# Patient Record
Sex: Male | Born: 1960 | Race: White | Hispanic: No | Marital: Married | Smoking: Never smoker
Health system: Southern US, Community
[De-identification: ages and names within clinical notes are randomized; demographics above are authoritative.]

## PROBLEM LIST (undated history)

## (undated) DIAGNOSIS — N200 Calculus of kidney: Secondary | ICD-10-CM

## (undated) DIAGNOSIS — S62609A Fracture of unspecified phalanx of unspecified finger, initial encounter for closed fracture: Secondary | ICD-10-CM

## (undated) HISTORY — DX: Calculus of kidney: N20.0

## (undated) HISTORY — PX: KNEE ARTHROSCOPY: SUR90

---

## 1998-04-04 DIAGNOSIS — N200 Calculus of kidney: Secondary | ICD-10-CM

## 1998-04-04 HISTORY — PX: KNEE ARTHROTOMY: SUR107

## 1998-04-04 HISTORY — DX: Calculus of kidney: N20.0

## 2002-07-12 ENCOUNTER — Emergency Department (HOSPITAL_COMMUNITY): Admission: EM | Admit: 2002-07-12 | Discharge: 2002-07-12 | Payer: Self-pay | Admitting: Emergency Medicine

## 2005-06-17 ENCOUNTER — Ambulatory Visit: Payer: Self-pay | Admitting: Pulmonary Disease

## 2005-09-07 ENCOUNTER — Ambulatory Visit: Payer: Self-pay | Admitting: Pulmonary Disease

## 2005-09-08 ENCOUNTER — Ambulatory Visit (HOSPITAL_BASED_OUTPATIENT_CLINIC_OR_DEPARTMENT_OTHER): Admission: RE | Admit: 2005-09-08 | Discharge: 2005-09-08 | Payer: Self-pay | Admitting: Pulmonary Disease

## 2012-04-04 HISTORY — PX: COLONOSCOPY: SHX174

## 2012-04-06 ENCOUNTER — Ambulatory Visit (AMBULATORY_SURGERY_CENTER): Payer: BC Managed Care – PPO | Admitting: *Deleted

## 2012-04-06 VITALS — Ht 73.0 in | Wt 176.2 lb

## 2012-04-06 DIAGNOSIS — Z1211 Encounter for screening for malignant neoplasm of colon: Secondary | ICD-10-CM

## 2012-04-06 MED ORDER — MOVIPREP 100 G PO SOLR: ORAL | Status: DC

## 2012-04-09 ENCOUNTER — Encounter: Payer: Self-pay | Admitting: Internal Medicine

## 2012-04-27 ENCOUNTER — Encounter: Payer: Self-pay | Admitting: Internal Medicine

## 2012-04-27 ENCOUNTER — Ambulatory Visit (AMBULATORY_SURGERY_CENTER): Payer: BC Managed Care – PPO | Admitting: Internal Medicine

## 2012-04-27 VITALS — BP 99/56 | HR 47 | Temp 98.9°F | Resp 23 | Ht 73.0 in | Wt 176.0 lb

## 2012-04-27 DIAGNOSIS — D126 Benign neoplasm of colon, unspecified: Secondary | ICD-10-CM

## 2012-04-27 DIAGNOSIS — Z1211 Encounter for screening for malignant neoplasm of colon: Secondary | ICD-10-CM

## 2012-04-27 MED ORDER — SODIUM CHLORIDE 0.9 % IV SOLN: 500.0000 mL | INTRAVENOUS | Status: DC

## 2012-04-27 NOTE — Progress Notes (Deleted)
YOU HAD AN ENDOSCOPIC PROCEDURE TODAY AT THE [facility]: Refer to the procedure report that was given to you for any specific questions about what was found during the examination.  If the procedure report does not answer your questions, please call your gastroenterologist to clarify.  If you requested that your care partner not be given the details of your procedure findings, then the procedure report has been included in a sealed envelope for you to review at your convenience later.  YOU SHOULD EXPECT: Some feelings of bloating in the abdomen. Passage of more gas than usual.  Walking can help get rid of the air that was put into your GI tract during the procedure and reduce the bloating. If you had a lower endoscopy (such as a colonoscopy or flexible sigmoidoscopy) you may notice spotting of blood in your stool or on the toilet paper. If you underwent a bowel prep for your procedure, then you may not have a normal bowel movement for a few days.  DIET: Your first meal following the procedure should be a light meal and then it is ok to progress to your normal diet.  A half-sandwich or bowl of soup is an example of a good first meal.  Heavy or fried foods are harder to digest and may make you feel nauseous or bloated.  Likewise meals heavy in dairy and vegetables can cause extra gas to form and this can also increase the bloating.  Drink plenty of fluids but you should avoid alcoholic beverages for 24 hours.  ACTIVITY: Your care partner should take you home directly after the procedure.  You should plan to take it easy, moving slowly for the rest of the day.  You can resume normal activity the day after the procedure however you should NOT DRIVE or use heavy machinery for 24 hours (because of the sedation medicines used during the test).    SYMPTOMS TO REPORT IMMEDIATELY: A gastroenterologist can be reached at any hour.  During normal business hours, 8:30 AM to 5:00 PM Monday through Friday,  call [phone].  After hours and on weekends, please call the GI answering service at [phone] who will take a message and have the physician on call contact you.   Following lower endoscopy (colonoscopy or flexible sigmoidoscopy):  Excessive amounts of blood in the stool  Significant tenderness or worsening of abdominal pains  Swelling of the abdomen that is new, acute  Fever of 100F or higher   FOLLOW UP: If any biopsies were taken you will be contacted by phone or by letter within the next 1-3 weeks.  Call your gastroenterologist if you have not heard about the biopsies in 3 weeks.  Our staff will call the home number listed on your records the next business day following your procedure to check on you and address any questions or concerns that you may have at that time regarding the information given to you following your procedure. This is a courtesy call and so if there is no answer at the home number and we have not heard from you through the emergency physician on call, we will assume that you have returned to your regular daily activities without incident.   Hold aspirin, aspirin containing products, and NSAIDS for 2 weeks  Await biopsy results  Continue all other medications  SIGNATURES/CONFIDENTIALITY: You and/or your care partner have signed paperwork which will be entered into your electronic medical record.  These signatures attest to the fact that that  the information above on your After Visit Summary has been reviewed and is understood.  Full responsibility of the confidentiality of this discharge information lies with you and/or your care-partner.

## 2012-04-27 NOTE — Op Note (Signed)
[facility] [address] [place], [address]   COLONOSCOPY PROCEDURE REPORT  PATIENT: Dean Marquez, Dean Marquez  MR#: [id number] BIRTHDATE: [birth year] , 51  yrs. old GENDER: Male ENDOSCOPIST: Beverley Fiedler, MD REFERRED BY: Percell Boston, MD PROCEDURE DATE:  04/27/2012 PROCEDURE:   Colonoscopy with snare polypectomy and Colonoscopy with cold biopsy polypectomy ASA CLASS:   Class I INDICATIONS:average risk screening and first colonoscopy. MEDICATIONS: MAC sedation, administered by CRNA and propofol (Diprivan) 400mg  IV  DESCRIPTION OF PROCEDURE:   After the risks benefits and alternatives of the procedure were thoroughly explained, informed consent was obtained.  A digital rectal exam revealed no rectal mass.   The LB [id number]  endoscope was introduced through the anus and advanced to the terminal ileum which was intubated for a short distance. No adverse events experienced.   The quality of the prep was good, using MoviPrep  The instrument was then slowly withdrawn as the colon was fully examined.   COLON FINDINGS: The mucosa appeared normal in the terminal ileum. A sessile polyp measuring 7 mm in size was found at the cecum.  A polypectomy was performed with a cold snare.  The resection was complete and the polyp tissue was completely retrieved.   A sessile polyp measuring 2 mm in size was found at the cecum.  A polypectomy was performed with cold forceps.  The resection was complete and the polyp tissue was completely retrieved.   The colon mucosa was otherwise normal.  Retroflexed views revealed no abnormalities. The time to cecum=3 minutes 23 seconds.  Withdrawal time=11 minutes 36 seconds.  The scope was withdrawn and the procedure completed. COMPLICATIONS: There were no complications.  ENDOSCOPIC IMPRESSION: 1.   Normal mucosa in the terminal ileum 2.   Sessile polyp measuring 7 mm in size was found at the cecum; polypectomy was performed with a cold  snare 3.   Sessile polyp measuring 2 mm in size was found at the cecum; polypectomy was performed with cold forceps 4.   The colon mucosa was otherwise normal  RECOMMENDATIONS: 1.  Hold aspirin, aspirin products, and anti-inflammatory medication for 2 weeks. 2.  Await pathology results 3.  If the polyps removed today are proven to be adenomatous (pre-cancerous) polyps, you will need a repeat colonoscopy in 5 years.  Otherwise you should continue to follow colorectal cancer screening guidelines for "routine risk" patients with colonoscopy in 10 years.  You will receive a letter within 1-2 weeks with the results of your biopsy as well as final recommendations.  Please call my office if you have not received a letter after 3 weeks.   eSigned:  Beverley Fiedler, MD 04/27/2012 10:02 AM      cc: The Patient, Percell Boston, MD

## 2012-04-27 NOTE — Progress Notes (Signed)
Patient did not experience any of the following events: a burn prior to discharge; a fall within the facility; wrong site/side/patient/procedure/implant event; or a hospital transfer or hospital admission upon discharge from the facility. (G8907) Patient did not have preoperative order for IV antibiotic SSI prophylaxis. (G8918)  

## 2012-04-27 NOTE — Progress Notes (Signed)
Pt stables to RR 

## 2012-04-27 NOTE — Patient Instructions (Addendum)

## 2012-04-30 ENCOUNTER — Telehealth: Payer: Self-pay | Admitting: *Deleted

## 2012-04-30 NOTE — Telephone Encounter (Signed)
  Follow up Call-  Call back number 04/27/2012  Post procedure Call Back phone  # [phone]  Permission to leave phone message Yes     Patient questions:  Do you have a fever, pain , or abdominal swelling? no Pain Score  0 *  Have you tolerated food without any problems? yes  Have you been able to return to your normal activities? yes  Do you have any questions about your discharge instructions: Diet   no Medications  no Follow up visit  no  Do you have questions or concerns about your Care? no  Actions: * If pain score is 4 or above: No action needed, pain <4.

## 2012-05-02 ENCOUNTER — Encounter: Payer: Self-pay | Admitting: Internal Medicine

## 2015-07-24 ENCOUNTER — Other Ambulatory Visit: Payer: Self-pay | Admitting: Orthopedic Surgery

## 2015-07-24 ENCOUNTER — Ambulatory Visit
Admission: RE | Admit: 2015-07-24 | Discharge: 2015-07-24 | Disposition: A | Discharge status: Home / Self Care | Payer: BLUE CROSS/BLUE SHIELD | Source: Ambulatory Visit | Attending: Orthopedic Surgery | Admitting: Orthopedic Surgery

## 2015-07-24 DIAGNOSIS — S62009S Unspecified fracture of navicular [scaphoid] bone of unspecified wrist, sequela: Secondary | ICD-10-CM

## 2017-04-14 ENCOUNTER — Encounter (HOSPITAL_BASED_OUTPATIENT_CLINIC_OR_DEPARTMENT_OTHER): Payer: Self-pay | Admitting: *Deleted

## 2017-04-14 ENCOUNTER — Other Ambulatory Visit: Payer: Self-pay

## 2017-04-14 ENCOUNTER — Other Ambulatory Visit: Payer: Self-pay | Admitting: Orthopedic Surgery

## 2017-04-15 NOTE — H&P (Signed)
Dean Marquez is an 57 y.o. male.   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  Because of continued pain, bruising, swelling, and noticeable deformity, he presented to [facility] on 04-10-17, and has been splinted with buddy taping since.  He noticed particularly that his finger had angulation, causing clumsiness with keyboarding.  He also plays the piano.   Past Medical History:  Diagnosis Date  . Finger fracture, right    5th proximal phalanx  . Kidney stones 2000    Past Surgical History:  Procedure Laterality Date  . KNEE ARTHROSCOPY Right   . KNEE ARTHROTOMY  2000   right    Family History  Problem Relation Age of Onset  . Colon cancer Father 22  . Stomach cancer Neg Hx    Social History:  reports that  has never smoked. he has never used smokeless tobacco. He reports that he drinks about 0.6 oz of alcohol per week. He reports that he does not use drugs.  Allergies: No Known Allergies  No medications prior to admission.    No results found for this or any previous visit (from the past 48 hour(s)). No results found.  Review of Systems  All other systems reviewed and are negative.   Height 6\' 1"  (1.854 m), weight 77.1 kg (170 lb). Physical Exam  Constitutional:  WD, WN, NAD HEENT:  NCAT, EOMI Neuro/Psych:  Alert & oriented to person, place, and time; appropriate mood & affect Lymphatic: No generalized UE edema or lymphadenopathy Extremities / MSK:  Both UE are normal with respect to appearance, ranges of motion, joint stability, muscle strength/tone, sensation, & perfusion except as otherwise noted:  The left ring finger is swollen and bruised about the proximal phalanx.  There is slight ulnar angulation deformity noted, causing some separation and touchdown between the tips of the ring and small fingers with  flexion.  Flexion is nearly full, despite the deformity  Labs / Xrays:  No radiographic studies obtained today.  Previous x-rays are reviewed, revealing a short oblique, perhaps slightly spiral fracture through the shaft of the proximal phalanx of the left small finger, with some overall shortening and slight ulnar angulation  Assessment: Angulated displaced left small finger proximal phalanx fracture  Plan:  I discussed these findings with him.  I discussed what he might expect with regard to outcomes were he to decide upon nonoperative treatment, as well as operative treatment.  In the interim, while he is deciding, he may continue to buddy tape.  I recommended perhaps using Coban rather than tape, and provided him a role for this.  He will decide over the next day or so and contact me should he decide to proceed operatively so that we can place him on the schedule for next week, likely Monday.    Jolyn Nap, MD 04/15/2017, 10:26 AM

## 2017-04-17 ENCOUNTER — Encounter (HOSPITAL_BASED_OUTPATIENT_CLINIC_OR_DEPARTMENT_OTHER)
Admission: RE | Disposition: A | Discharge status: Home / Self Care | Payer: Self-pay | Source: Ambulatory Visit | Attending: Orthopedic Surgery

## 2017-04-17 ENCOUNTER — Ambulatory Visit (HOSPITAL_BASED_OUTPATIENT_CLINIC_OR_DEPARTMENT_OTHER)
Admission: RE | Admit: 2017-04-17 | Discharge: 2017-04-17 | Disposition: A | Discharge status: Home / Self Care | Payer: BLUE CROSS/BLUE SHIELD | Source: Ambulatory Visit | Attending: Orthopedic Surgery | Admitting: Orthopedic Surgery

## 2017-04-17 ENCOUNTER — Other Ambulatory Visit: Payer: Self-pay

## 2017-04-17 ENCOUNTER — Encounter (HOSPITAL_BASED_OUTPATIENT_CLINIC_OR_DEPARTMENT_OTHER): Payer: Self-pay | Admitting: Certified Registered"

## 2017-04-17 ENCOUNTER — Ambulatory Visit (HOSPITAL_BASED_OUTPATIENT_CLINIC_OR_DEPARTMENT_OTHER): Payer: BLUE CROSS/BLUE SHIELD | Admitting: Certified Registered"

## 2017-04-17 ENCOUNTER — Ambulatory Visit (HOSPITAL_COMMUNITY): Payer: BLUE CROSS/BLUE SHIELD

## 2017-04-17 DIAGNOSIS — S62617A Displaced fracture of proximal phalanx of left little finger, initial encounter for closed fracture: Secondary | ICD-10-CM | POA: Diagnosis present

## 2017-04-17 DIAGNOSIS — Y9367 Activity, basketball: Secondary | ICD-10-CM | POA: Diagnosis not present

## 2017-04-17 DIAGNOSIS — Z419 Encounter for procedure for purposes other than remedying health state, unspecified: Secondary | ICD-10-CM

## 2017-04-17 HISTORY — PX: OPEN REDUCTION INTERNAL FIXATION (ORIF) PROXIMAL PHALANX: SHX6235

## 2017-04-17 HISTORY — DX: Fracture of unspecified phalanx of unspecified finger, initial encounter for closed fracture: S62.609A

## 2017-04-17 SURGERY — OPEN REDUCTION INTERNAL FIXATION (ORIF) PROXIMAL PHALANX
Anesthesia: Monitor Anesthesia Care | Site: Little Finger | Laterality: Right

## 2017-04-17 MED ORDER — IBUPROFEN 200 MG PO TABS: 600.0000 mg | ORAL_TABLET | Freq: Four times a day (QID) | ORAL | 0 refills | Status: DC | PRN

## 2017-04-17 MED ORDER — MIDAZOLAM HCL 2 MG/2ML IJ SOLN
INTRAMUSCULAR | Status: AC
  Filled 2017-04-17: qty 2

## 2017-04-17 MED ORDER — EPHEDRINE 5 MG/ML INJ
INTRAVENOUS | Status: AC
  Filled 2017-04-17: qty 50

## 2017-04-17 MED ORDER — PROPOFOL 500 MG/50ML IV EMUL
INTRAVENOUS | Status: DC | PRN
  Administered 2017-04-17: 75 ug/kg/min via INTRAVENOUS

## 2017-04-17 MED ORDER — LACTATED RINGERS IV SOLN: INTRAVENOUS | Status: DC

## 2017-04-17 MED ORDER — ONDANSETRON HCL 4 MG/2ML IJ SOLN
INTRAMUSCULAR | Status: AC
  Filled 2017-04-17: qty 10

## 2017-04-17 MED ORDER — SCOPOLAMINE 1 MG/3DAYS TD PT72: 1.0000 | MEDICATED_PATCH | Freq: Once | TRANSDERMAL | Status: DC | PRN

## 2017-04-17 MED ORDER — FENTANYL CITRATE (PF) 100 MCG/2ML IJ SOLN: 25.0000 ug | INTRAMUSCULAR | Status: DC | PRN

## 2017-04-17 MED ORDER — CEFAZOLIN SODIUM-DEXTROSE 2-4 GM/100ML-% IV SOLN
INTRAVENOUS | Status: AC
  Filled 2017-04-17: qty 100

## 2017-04-17 MED ORDER — LACTATED RINGERS IV SOLN
INTRAVENOUS | Status: DC
  Administered 2017-04-17: 09:00:00 via INTRAVENOUS

## 2017-04-17 MED ORDER — MIDAZOLAM HCL 2 MG/2ML IJ SOLN: 1.0000 mg | INTRAMUSCULAR | Status: DC | PRN

## 2017-04-17 MED ORDER — LIDOCAINE HCL (PF) 1 % IJ SOLN
INTRAMUSCULAR | Status: DC | PRN
  Administered 2017-04-17: 5 mL

## 2017-04-17 MED ORDER — ACETAMINOPHEN 325 MG PO TABS: 650.0000 mg | ORAL_TABLET | Freq: Four times a day (QID) | ORAL | Status: DC | PRN

## 2017-04-17 MED ORDER — ONDANSETRON HCL 4 MG/2ML IJ SOLN
INTRAMUSCULAR | Status: DC | PRN
  Administered 2017-04-17: 4 mg via INTRAVENOUS

## 2017-04-17 MED ORDER — LIDOCAINE HCL (CARDIAC) 20 MG/ML IV SOLN
INTRAVENOUS | Status: DC | PRN
  Administered 2017-04-17: 60 mg via INTRAVENOUS

## 2017-04-17 MED ORDER — FENTANYL CITRATE (PF) 100 MCG/2ML IJ SOLN: 50.0000 ug | INTRAMUSCULAR | Status: DC | PRN

## 2017-04-17 MED ORDER — PROPOFOL 500 MG/50ML IV EMUL
INTRAVENOUS | Status: AC
  Filled 2017-04-17: qty 100

## 2017-04-17 MED ORDER — DEXAMETHASONE SODIUM PHOSPHATE 10 MG/ML IJ SOLN
INTRAMUSCULAR | Status: AC
  Filled 2017-04-17: qty 2

## 2017-04-17 MED ORDER — LIDOCAINE 2% (20 MG/ML) 5 ML SYRINGE
INTRAMUSCULAR | Status: AC
  Filled 2017-04-17: qty 20

## 2017-04-17 MED ORDER — HYDROCODONE-ACETAMINOPHEN 5-325 MG PO TABS: 1.0000 | ORAL_TABLET | Freq: Four times a day (QID) | ORAL | 0 refills | Status: DC | PRN

## 2017-04-17 MED ORDER — MEPERIDINE HCL 25 MG/ML IJ SOLN: 6.2500 mg | INTRAMUSCULAR | Status: DC | PRN

## 2017-04-17 MED ORDER — BUPIVACAINE-EPINEPHRINE 0.5% -1:200000 IJ SOLN
INTRAMUSCULAR | Status: DC | PRN
  Administered 2017-04-17: 5 mL

## 2017-04-17 MED ORDER — METOCLOPRAMIDE HCL 5 MG/ML IJ SOLN: 10.0000 mg | Freq: Once | INTRAMUSCULAR | Status: DC | PRN

## 2017-04-17 MED ORDER — FENTANYL CITRATE (PF) 100 MCG/2ML IJ SOLN
INTRAMUSCULAR | Status: AC
  Filled 2017-04-17: qty 2

## 2017-04-17 MED ORDER — CEFAZOLIN SODIUM-DEXTROSE 2-4 GM/100ML-% IV SOLN
2.0000 g | INTRAVENOUS | Status: AC
  Administered 2017-04-17: 2 g via INTRAVENOUS

## 2017-04-17 MED ORDER — PHENYLEPHRINE 40 MCG/ML (10ML) SYRINGE FOR IV PUSH (FOR BLOOD PRESSURE SUPPORT)
PREFILLED_SYRINGE | INTRAVENOUS | Status: AC
  Filled 2017-04-17: qty 10

## 2017-04-17 SURGICAL SUPPLY — 62 items
BANDAGE COBAN STERILE 2 (GAUZE/BANDAGES/DRESSINGS) IMPLANT
BIT DRILL 1.1 (BIT) ×2
BIT DRILL 60X20X1.1XQC TMX (BIT) IMPLANT
BIT DRL 60X20X1.1XQC TMX (BIT) ×1
BLADE MINI RND TIP GREEN BEAV (BLADE) IMPLANT
BLADE SURG 15 STRL LF DISP TIS (BLADE) ×1 IMPLANT
BLADE SURG 15 STRL SS (BLADE) ×2
BNDG CMPR 9X4 STRL LF SNTH (GAUZE/BANDAGES/DRESSINGS) ×1
BNDG COHESIVE 4X5 TAN STRL (GAUZE/BANDAGES/DRESSINGS) ×2 IMPLANT
BNDG ESMARK 4X9 LF (GAUZE/BANDAGES/DRESSINGS) ×1 IMPLANT
BNDG GAUZE ELAST 4 BULKY (GAUZE/BANDAGES/DRESSINGS) ×2 IMPLANT
CAP PIN PROTECTOR ORTHO WHT (CAP) IMPLANT
CHLORAPREP W/TINT 26ML (MISCELLANEOUS) ×2 IMPLANT
CORD BIPOLAR FORCEPS 12FT (ELECTRODE) ×2 IMPLANT
COVER BACK TABLE 60X90IN (DRAPES) ×2 IMPLANT
COVER MAYO STAND STRL (DRAPES) ×2 IMPLANT
CUFF TOURNIQUET SINGLE 18IN (TOURNIQUET CUFF) ×1 IMPLANT
DRAPE C-ARM 42X72 X-RAY (DRAPES) ×2 IMPLANT
DRAPE EXTREMITY T 121X128X90 (DRAPE) ×2 IMPLANT
DRAPE SURG 17X23 STRL (DRAPES) ×2 IMPLANT
DRIVER BIT 1.5 (TRAUMA) ×1 IMPLANT
DRSG EMULSION OIL 3X3 NADH (GAUZE/BANDAGES/DRESSINGS) ×2 IMPLANT
GAUZE SPONGE 4X4 12PLY STRL LF (GAUZE/BANDAGES/DRESSINGS) ×2 IMPLANT
GLOVE BIO SURGEON STRL SZ7.5 (GLOVE) ×2 IMPLANT
GLOVE BIOGEL PI IND STRL 7.0 (GLOVE) ×1 IMPLANT
GLOVE BIOGEL PI IND STRL 8 (GLOVE) ×1 IMPLANT
GLOVE BIOGEL PI INDICATOR 7.0 (GLOVE) ×1
GLOVE BIOGEL PI INDICATOR 8 (GLOVE) ×1
GLOVE ECLIPSE 6.5 STRL STRAW (GLOVE) ×2 IMPLANT
GOWN STRL REUS W/ TWL LRG LVL3 (GOWN DISPOSABLE) ×2 IMPLANT
GOWN STRL REUS W/TWL LRG LVL3 (GOWN DISPOSABLE) ×4
GOWN STRL REUS W/TWL XL LVL3 (GOWN DISPOSABLE) ×2 IMPLANT
K-WIRE .045X4 (WIRE) IMPLANT
K-WIRE 9  SMOOTH .045 (WIRE) IMPLANT
LOCK SCREW 1.5X9MM (Screw) ×2 IMPLANT
NDL HYPO 25X1 1.5 SAFETY (NEEDLE) IMPLANT
NEEDLE HYPO 25X1 1.5 SAFETY (NEEDLE) ×2 IMPLANT
NS IRRIG 1000ML POUR BTL (IV SOLUTION) ×2 IMPLANT
PACK BASIN DAY SURGERY FS (CUSTOM PROCEDURE TRAY) ×2 IMPLANT
PADDING CAST ABS 4INX4YD NS (CAST SUPPLIES) ×1
PADDING CAST ABS COTTON 4X4 ST (CAST SUPPLIES) IMPLANT
PLATE T SMALL 1.5MM (Plate) ×1 IMPLANT
RUBBERBAND STERILE (MISCELLANEOUS) ×2 IMPLANT
SCREW L 1.5X12 (Screw) ×2 IMPLANT
SCREW L 1.5X14 (Screw) ×1 IMPLANT
SCREW LOCK 1.5X9MM (Screw) IMPLANT
SCREW LOCKING 1.5X10 (Screw) ×2 IMPLANT
SCREW LOCKING 1.5X11MM (Screw) ×2 IMPLANT
SPLINT PLASTER CAST XFAST 3X15 (CAST SUPPLIES) IMPLANT
SPLINT PLASTER XTRA FASTSET 3X (CAST SUPPLIES)
STOCKINETTE 6  STRL (DRAPES) ×1
STOCKINETTE 6 STRL (DRAPES) ×1 IMPLANT
SUCTION FRAZIER HANDLE 10FR (MISCELLANEOUS)
SUCTION TUBE FRAZIER 10FR DISP (MISCELLANEOUS) IMPLANT
SUT VICRYL RAPIDE 4-0 (SUTURE) ×2 IMPLANT
SUT VICRYL RAPIDE 4/0 PS 2 (SUTURE) ×1 IMPLANT
SYR 10ML LL (SYRINGE) ×1 IMPLANT
SYR BULB 3OZ (MISCELLANEOUS) ×1 IMPLANT
TOWEL OR 17X24 6PK STRL BLUE (TOWEL DISPOSABLE) ×2 IMPLANT
TOWEL OR NON WOVEN STRL DISP B (DISPOSABLE) ×2 IMPLANT
TUBE CONNECTING 20X1/4 (TUBING) IMPLANT
UNDERPAD 30X30 (UNDERPADS AND DIAPERS) ×2 IMPLANT

## 2017-04-17 NOTE — Anesthesia Postprocedure Evaluation (Signed)
Anesthesia Post Note  Patient: Dean Marquez  Procedure(s) Performed: OPEN REDUCTION INTERNAL FIXATION (ORIF) PROXIMAL PHALANX (Right Little Finger)     Anesthesia Post Evaluation  Last Vitals:  Vitals:   04/17/17 1215 04/17/17 1246  BP: 105/81 114/75  Pulse: 64 62  Resp: 20 16  Temp:  36.5 C  SpO2: 100% 100%    Last Pain:  Vitals:   04/17/17 1246  TempSrc: Oral  PainSc: 0-No pain                 Montez Hageman

## 2017-04-17 NOTE — Anesthesia Preprocedure Evaluation (Signed)
Anesthesia Evaluation  Patient identified by MRN, date of birth, ID band Patient awake    Reviewed: Allergy & Precautions, NPO status , Patient's Chart, lab work & pertinent test results  Airway Mallampati: II  TM Distance: >3 FB Neck ROM: Full    Dental no notable dental hx.    Pulmonary neg pulmonary ROS,    Pulmonary exam normal breath sounds clear to auscultation       Cardiovascular negative cardio ROS Normal cardiovascular exam Rhythm:Regular Rate:Normal     Neuro/Psych negative neurological ROS  negative psych ROS   GI/Hepatic negative GI ROS, Neg liver ROS,   Endo/Other  negative endocrine ROS  Renal/GU negative Renal ROS  negative genitourinary   Musculoskeletal negative musculoskeletal ROS (+)   Abdominal   Peds negative pediatric ROS (+)  Hematology negative hematology ROS (+)   Anesthesia Other Findings   Reproductive/Obstetrics negative OB ROS                             Anesthesia Physical Anesthesia Plan  ASA: I  Anesthesia Plan: MAC   Post-op Pain Management:    Induction: Intravenous  PONV Risk Score and Plan: 1 and Treatment may vary due to age or medical condition  Airway Management Planned: Natural Airway  Additional Equipment:   Intra-op Plan:   Post-operative Plan:   Informed Consent: I have reviewed the patients History and Physical, chart, labs and discussed the procedure including the risks, benefits and alternatives for the proposed anesthesia with the patient or authorized representative who has indicated his/her understanding and acceptance.   Dental advisory given  Plan Discussed with: CRNA  Anesthesia Plan Comments: (Pt prefers digital block with no sedation)        Anesthesia Quick Evaluation

## 2017-04-17 NOTE — Transfer of Care (Signed)
Immediate Anesthesia Transfer of Care Note  Patient: Dean Marquez  Procedure(s) Performed: OPEN REDUCTION INTERNAL FIXATION (ORIF) PROXIMAL PHALANX (Right Little Finger)  Patient Location: PACU  Anesthesia Type:MAC combined with regional for post-op pain  Level of Consciousness: awake, alert , oriented and patient cooperative  Airway & Oxygen Therapy: Patient Spontanous Breathing and Patient connected to face mask oxygen  Post-op Assessment: Report given to RN and Post -op Vital signs reviewed and stable  Post vital signs: Reviewed and stable  Last Vitals:  Vitals:   04/17/17 0857  BP: 100/60  Pulse: (!) 52  Resp: 16  Temp: 36.7 C  SpO2: 97%    Last Pain:  Vitals:   04/17/17 0857  TempSrc: Oral  PainSc: 1       Patients Stated Pain Goal: 1 (27/25/36 6440)  Complications: No apparent anesthesia complications

## 2017-04-17 NOTE — Op Note (Addendum)
04/17/2017  10:42 AM  PATIENT:  Dean Marquez  57 y.o. male  PRE-OPERATIVE DIAGNOSIS:  Displaced R SF P1 shaft fx  POST-OPERATIVE DIAGNOSIS:  Same  PROCEDURE:  ORIF R SF P1 fx  SURGEON: Rayvon Char. Grandville Silos, MD  PHYSICIAN ASSISTANT: Morley Kos, OPA-C  ANESTHESIA:  local and MAC  SPECIMENS:  None  DRAINS:   None  EBL:  less than 50 mL  PREOPERATIVE INDICATIONS:  Dean Marquez is a  57 y.o. male with a displaced R SF P1 fx.  The risks benefits and alternatives were discussed with the patient preoperatively including but not limited to the risks of infection, bleeding, nerve injury, cardiopulmonary complications, the need for revision surgery, among others, and the patient verbalized understanding and consented to proceed.  OPERATIVE IMPLANTS: Biomet ALPS 1.50m plate/screws  OPERATIVE PROCEDURE:  After receiving prophylactic antibiotics, the patient was escorted to the operative theatre and placed in a supine position.   A surgical "time-out" was performed during which the planned procedure, proposed operative site, and the correct patient identity were compared to the operative consent and agreement confirmed by the circulating nurse according to current facility policy.  Digital block with a mixture of lidocaine and Marcaine Baring epinephrine was performed by me. Following application of a tourniquet to the operative extremity, the exposed skin was prepped with Chloraprep and draped in the usual sterile fashion.  The limb was exsanguinated with an Esmarch bandage and the tourniquet inflated to approximately 1074mg higher than systolic BP.  An incision was marked that created a very broad-based radially based dorsal flap.  The skin was incised sharply with a scalpel, subcutaneous tissues were dissected with blunt and spreading dissection.  The extensor apparatus was identified and split in the midline and reflected.  The periosteum was also reflected.  The fracture was identified,  reduced, and secured provisionally the before a 1.5 mm plate was applied in standard fashion with locking screws. The fracture was actually comminuted, with a long dorsal shell piece as a separate fragment, may be 8 or 9 mm long by 3-4 wide.  There was a separate volar fragment as well.  The plate was a small T plate from the set.  Reduction was judged to be near-anatomic, and although the digit appeared still very slightly ulnarly angulated, it was difficult to tell if it was through the fracture or just the MP joint due to the local anesthetic that had been administered.  With passive flexion, the digits touchdown point was now nicely adjacent to the ring finger, which was a correction from its preoperative condition.  Final fluoroscopic images were obtained and saved.  Attention was turned to closure.  The wound was irrigated, the periosteum reapproximated with 4-0 Vicryl Rapide running suture.  The extensor tendon was repaired with running 4-0 Vicryl suture and the tourniquet was deflated.  The wound was irrigated, some additional hemostasis obtained and the skin was closed with 4-0 Vicryl Rapide mixture of interrupted and running horizontal mattress sutures.  A short arm splint dressing was applied, placing the ring and small fingers together, flexed at the MP joint and allowing the thumb, index, and long fingers out.  He was taken to the recovery room in stable condition.  DISPOSITION: He will be discharged home today with typical instructions, returning later this week to therapy to have a splint constructed, begin range of motion exercises, and return to see me in 10-15 days with new x-rays of the right small finger out of splint.

## 2017-04-17 NOTE — Anesthesia Procedure Notes (Signed)
Procedure Name: MAC Date/Time: 04/17/2017 11:00 AM Performed by: Signe Colt, CRNA Pre-anesthesia Checklist: Patient identified, Emergency Drugs available, Suction available, Patient being monitored and Timeout performed Patient Re-evaluated:Patient Re-evaluated prior to induction Oxygen Delivery Method: Simple face mask

## 2017-04-17 NOTE — Discharge Instructions (Signed)
Discharge Instructions   You have a dressing with a plaster splint incorporated in it. Move your fingers as much as possible, making a full fist and fully opening the fist. Elevate your hand to reduce pain & swelling of the digits.  Ice over the operative site may be helpful to reduce pain & swelling.  DO NOT USE HEAT. Pain medicine has been prescribed for you.  Tylenol and Ibuprofen can be taken together every 6 hours. Take Norco for severe breakthrough pain. Leave the dressing in place until you return to our office.  You may shower, but keep the bandage clean & dry.  You may drive a car when you are off of prescription pain medications and can safely control your vehicle with both hands. Our office will call you to arrange follow-up   Please call [phone] during normal business hours or [phone] after hours for any problems. Including the following:  - excessive redness of the incisions - drainage for more than 4 days - fever of more than 101.5 F  *Please note that pain medications will not be refilled after hours or on weekends.   Post Anesthesia Home Care Instructions  Activity: Get plenty of rest for the remainder of the day. A responsible individual must stay with you for 24 hours following the procedure.  For the next 24 hours, DO NOT: -Drive a car -Paediatric nurse -Drink alcoholic beverages -Take any medication unless instructed by your physician -Make any legal decisions or sign important papers.  Meals: Start with liquid foods such as gelatin or soup. Progress to regular foods as tolerated. Avoid greasy, spicy, heavy foods. If nausea and/or vomiting occur, drink only clear liquids until the nausea and/or vomiting subsides. Call your physician if vomiting continues.  Special Instructions/Symptoms: Your throat may feel dry or sore from the anesthesia or the breathing tube placed in your throat during surgery. If this causes discomfort, gargle with warm salt  water. The discomfort should disappear within 24 hours.  If you had a scopolamine patch placed behind your ear for the management of post- operative nausea and/or vomiting:  1. The medication in the patch is effective for 72 hours, after which it should be removed.  Wrap patch in a tissue and discard in the trash. Wash hands thoroughly with soap and water. 2. You may remove the patch earlier than 72 hours if you experience unpleasant side effects which may include dry mouth, dizziness or visual disturbances. 3. Avoid touching the patch. Wash your hands with soap and water after contact with the patch.

## 2017-04-17 NOTE — Interval H&P Note (Signed)
History and Physical Interval Note:  04/17/2017 10:42 AM  Dean Marquez  has presented today for surgery, with the diagnosis of DISPLACED PROXIMAL PHALANX FRACTURE S62.616A  The various methods of treatment have been discussed with the patient and family. After consideration of risks, benefits and other options for treatment, the patient has consented to  Procedure(s): OPEN REDUCTION INTERNAL FIXATION (ORIF) PROXIMAL PHALANX (Right) as a surgical intervention .  The patient's history has been reviewed, patient examined, no change in status, stable for surgery.  I have reviewed the patient's chart and labs.  Questions were answered to the patient's satisfaction.     Jolyn Nap

## 2017-04-18 ENCOUNTER — Encounter (HOSPITAL_BASED_OUTPATIENT_CLINIC_OR_DEPARTMENT_OTHER): Payer: Self-pay | Admitting: Orthopedic Surgery

## 2017-04-18 NOTE — Addendum Note (Signed)
Addendum  created 04/18/17 9702 by Donita Newland, Ernesta Amble, CRNA   Charge Capture section accepted

## 2017-05-09 ENCOUNTER — Encounter: Payer: Self-pay | Admitting: Internal Medicine

## 2018-09-15 ENCOUNTER — Other Ambulatory Visit: Payer: Self-pay | Admitting: Nephrology

## 2019-06-19 ENCOUNTER — Encounter: Payer: Self-pay | Admitting: Internal Medicine

## 2019-07-17 ENCOUNTER — Other Ambulatory Visit: Payer: Self-pay

## 2019-07-17 ENCOUNTER — Ambulatory Visit (AMBULATORY_SURGERY_CENTER): Payer: Self-pay

## 2019-07-17 VITALS — Temp 97.3°F | Ht 73.0 in | Wt 172.0 lb

## 2019-07-17 DIAGNOSIS — Z86010 Personal history of colonic polyps: Secondary | ICD-10-CM

## 2019-07-17 MED ORDER — SUTAB 1479-225-188 MG PO TABS: 12.0000 | ORAL_TABLET | ORAL | 0 refills | Status: DC

## 2019-07-17 NOTE — Progress Notes (Signed)
No allergies to soy or egg Pt is not on blood thinners or diet pills Denies issues with sedation/intubation Denies atrial flutter/fib Denies constipation   Pt is aware of Covid safety and care partner requirements.      

## 2019-07-19 ENCOUNTER — Telehealth: Payer: Self-pay | Admitting: Internal Medicine

## 2019-07-19 DIAGNOSIS — Z86010 Personal history of colonic polyps: Secondary | ICD-10-CM

## 2019-07-19 MED ORDER — SUTAB 1479-225-188 MG PO TABS: 24.0000 | ORAL_TABLET | ORAL | 0 refills | Status: DC

## 2019-07-19 NOTE — Telephone Encounter (Signed)
Sent in [place] to [facility] [place]  [place] in [place] sent to changed Pharmacy

## 2019-07-23 ENCOUNTER — Telehealth: Payer: Self-pay

## 2019-07-23 ENCOUNTER — Telehealth: Payer: Self-pay | Admitting: Internal Medicine

## 2019-07-23 DIAGNOSIS — Z86010 Personal history of colonic polyps: Secondary | ICD-10-CM

## 2019-07-23 MED ORDER — SUTAB 1479-225-188 MG PO TABS: 1.0000 | ORAL_TABLET | ORAL | 0 refills | Status: DC

## 2019-07-23 NOTE — Telephone Encounter (Signed)
S[facility]ab [facility] per pt's request. Pt called and notified.

## 2019-07-23 NOTE — Telephone Encounter (Signed)
rec'd fax from [facility] that Sutab was not covered.  Called and spoke to pharmacist.  She ran through the codes we sent as therapy first and it will $40.

## 2019-07-23 NOTE — Telephone Encounter (Signed)
Pt is scheduled for a colon 4/28 and stated that [facility] Sutab [facility].  Please resend prescription.  [facility]

## 2019-07-29 ENCOUNTER — Encounter: Payer: Self-pay | Admitting: Internal Medicine

## 2019-07-31 ENCOUNTER — Encounter: Payer: Self-pay | Admitting: Internal Medicine

## 2019-07-31 ENCOUNTER — Ambulatory Visit (AMBULATORY_SURGERY_CENTER): Payer: BC Managed Care – PPO | Admitting: Internal Medicine

## 2019-07-31 ENCOUNTER — Other Ambulatory Visit: Payer: Self-pay

## 2019-07-31 VITALS — BP 100/64 | HR 47 | Temp 97.5°F | Resp 14 | Ht 73.0 in | Wt 172.0 lb

## 2019-07-31 DIAGNOSIS — Z800 Family history of malignant neoplasm of digestive organs: Secondary | ICD-10-CM

## 2019-07-31 DIAGNOSIS — Z86010 Personal history of colonic polyps: Secondary | ICD-10-CM | POA: Diagnosis present

## 2019-07-31 MED ORDER — SODIUM CHLORIDE 0.9 % IV SOLN: 500.0000 mL | Freq: Once | INTRAVENOUS | Status: DC

## 2019-07-31 NOTE — Op Note (Addendum)
[facility] Patient Name: Dean Marquez Procedure Date: 07/31/2019 11:13 AM MRN: [id number] Endoscopist: Jerene Bears , MD Age: 59 Referring MD:  Date of Birth: 1960/11/13 Gender: Male Account #: [id number] Procedure:                Colonoscopy Indications:              High risk colon cancer surveillance: Personal                            history of non-advanced adenomas (2), Last                            colonoscopy: January 2014; family history of colon                            cancer in father Medicines:                Monitored Anesthesia Care Procedure:                Pre-Anesthesia Assessment:                           - Prior to the procedure, a History and Physical                            was performed, and patient medications and                            allergies were reviewed. The patient's tolerance of                            previous anesthesia was also reviewed. The risks                            and benefits of the procedure and the sedation                            options and risks were discussed with the patient.                            All questions were answered, and informed consent                            was obtained. Prior Anticoagulants: The patient has                            taken no previous anticoagulant or antiplatelet                            agents. ASA Grade Assessment: I - A normal, healthy                            patient. After reviewing the risks and benefits,  the patient was deemed in satisfactory condition to                            undergo the procedure.                           After obtaining informed consent, the colonoscope                            was passed under direct vision. Throughout the                            procedure, the patient's blood pressure, pulse, and                            oxygen saturations were monitored continuously. The            Colonoscope was introduced through the anus and                            advanced to the cecum, identified by appendiceal                            orifice and ileocecal valve. The colonoscopy was                            performed without difficulty. The patient tolerated                            the procedure well. The quality of the bowel                            preparation was excellent. The ileocecal valve,                            appendiceal orifice, and rectum were photographed. Scope In: 11:21:50 AM Scope Out: 11:36:45 AM Scope Withdrawal Time: 0 hours 9 minutes 26 seconds  Total Procedure Duration: 0 hours 14 minutes 55 seconds  Findings:                 The digital rectal exam was normal.                           The entire examined colon appeared normal on direct                            and retroflexion views. Complications:            No immediate complications. Estimated Blood Loss:     Estimated blood loss: none. Impression:               - The entire examined colon is normal on direct and                            retroflexion views.                           -  No specimens collected. Recommendation:           - Patient has a contact number available for                            emergencies. The signs and symptoms of potential                            delayed complications were discussed with the                            patient. Return to normal activities tomorrow.                            Written discharge instructions were provided to the                            patient.                           - Resume previous diet.                           - Continue present medications.                           - Repeat colonoscopy in 5 years for surveillance                            based on family history of colon cancer in father. Jerene Bears, MD 07/31/2019 11:38:45 AM This report has been signed electronically.

## 2019-07-31 NOTE — Patient Instructions (Signed)
Please read handouts provided. Continue present medications.   YOU HAD AN ENDOSCOPIC PROCEDURE TODAY AT THE [facility]:   Refer to the procedure report that was given to you for any specific questions about what was found during the examination.  If the procedure report does not answer your questions, please call your gastroenterologist to clarify.  If you requested that your care partner not be given the details of your procedure findings, then the procedure report has been included in a sealed envelope for you to review at your convenience later.  YOU SHOULD EXPECT: Some feelings of bloating in the abdomen. Passage of more gas than usual.  Walking can help get rid of the air that was put into your GI tract during the procedure and reduce the bloating. If you had a lower endoscopy (such as a colonoscopy or flexible sigmoidoscopy) you may notice spotting of blood in your stool or on the toilet paper. If you underwent a bowel prep for your procedure, you may not have a normal bowel movement for a few days.  Please Note:  You might notice some irritation and congestion in your nose or some drainage.  This is from the oxygen used during your procedure.  There is no need for concern and it should clear up in a day or so.  SYMPTOMS TO REPORT IMMEDIATELY:  Following lower endoscopy (colonoscopy or flexible sigmoidoscopy):  Excessive amounts of blood in the stool  Significant tenderness or worsening of abdominal pains  Swelling of the abdomen that is new, acute  Fever of 100F or higher   For urgent or emergent issues, a gastroenterologist can be reached at any hour by calling [phone]. Do not use MyChart messaging for urgent concerns.    DIET:  We do recommend a small meal at first, but then you may proceed to your regular diet.  Drink plenty of fluids but you should avoid alcoholic beverages for 24 hours.  ACTIVITY:  You should plan to take it easy for the rest of today and  you should NOT DRIVE or use heavy machinery until tomorrow (because of the sedation medicines used during the test).    FOLLOW UP: Our staff will call the number listed on your records 48-72 hours following your procedure to check on you and address any questions or concerns that you may have regarding the information given to you following your procedure. If we do not reach you, we will leave a message.  We will attempt to reach you two times.  During this call, we will ask if you have developed any symptoms of COVID 19. If you develop any symptoms (ie: fever, flu-like symptoms, shortness of breath, cough etc.) before then, please call [phone].  If you test positive for Covid 19 in the 2 weeks post procedure, please call and report this information to us.    If any biopsies were taken you will be contacted by phone or by letter within the next 1-3 weeks.  Please call us at [phone] if you have not heard about the biopsies in 3 weeks.    SIGNATURES/CONFIDENTIALITY: You and/or your care partner have signed paperwork which will be entered into your electronic medical record.  These signatures attest to the fact that that the information above on your After Visit Summary has been reviewed and is understood.  Full responsibility of the confidentiality of this discharge information lies with you and/or your care-partner.  

## 2019-07-31 NOTE — Progress Notes (Signed)
DT- vitals JB- temp 

## 2019-07-31 NOTE — Progress Notes (Signed)
Report to PACU, RN, vss, BBS= Clear.  

## 2019-07-31 NOTE — Progress Notes (Signed)
Pt's states no medical or surgical changes since previsit or office visit. 

## 2019-08-02 ENCOUNTER — Telehealth: Payer: Self-pay

## 2019-08-02 NOTE — Telephone Encounter (Signed)
  Follow up Call-  Call back number 07/31/2019  Post procedure Call Back phone  # [phone]  Permission to leave phone message Yes  Some recent data might be hidden     Patient questions:  Do you have a fever, pain , or abdominal swelling? No. Pain Score  0 *  Have you tolerated food without any problems? Yes  Have you been able to return to your normal activities? Yes  Do you have any questions about your discharge instructions: Diet   No Medications  No Follow up visit  No  Do you have questions or concerns about your Care? No.  Actions: * If pain score is 4 or above: No action needed, pain <4. 1. Have you developed a fever since your procedure? no  2.   Have you had an respiratory symptoms (SOB or cough) since your procedure? no  3.   Have you tested positive for COVID 19 since your procedure no  4.   Have you had any family members/close contacts diagnosed with the COVID 19 since your procedure?  no   If yes to any of these questions please route to Joylene John, RN and Erenest Rasher, RN

## 2019-08-02 NOTE — Telephone Encounter (Signed)
Attempted to reach patient for post-procedure f/u call. No answer. Left message that we will make another attempt to reach him later today and for him to please not hesitate to call us if he has any questions/concerns regarding his care. 

## 2020-11-04 ENCOUNTER — Observation Stay (HOSPITAL_COMMUNITY): Payer: 59

## 2020-11-04 ENCOUNTER — Inpatient Hospital Stay (HOSPITAL_COMMUNITY)
Admission: AD | Admit: 2020-11-04 | Discharge: 2020-11-06 | DRG: 391 | Disposition: A | Discharge status: Home / Self Care | Payer: 59 | Source: Ambulatory Visit | Attending: Internal Medicine | Admitting: Internal Medicine

## 2020-11-04 DIAGNOSIS — Z20822 Contact with and (suspected) exposure to covid-19: Secondary | ICD-10-CM | POA: Diagnosis present

## 2020-11-04 DIAGNOSIS — E43 Unspecified severe protein-calorie malnutrition: Secondary | ICD-10-CM | POA: Diagnosis present

## 2020-11-04 DIAGNOSIS — I959 Hypotension, unspecified: Secondary | ICD-10-CM | POA: Diagnosis present

## 2020-11-04 DIAGNOSIS — R296 Repeated falls: Secondary | ICD-10-CM | POA: Diagnosis present

## 2020-11-04 DIAGNOSIS — K909 Intestinal malabsorption, unspecified: Principal | ICD-10-CM | POA: Diagnosis present

## 2020-11-04 DIAGNOSIS — D122 Benign neoplasm of ascending colon: Secondary | ICD-10-CM

## 2020-11-04 DIAGNOSIS — Z79899 Other long term (current) drug therapy: Secondary | ICD-10-CM

## 2020-11-04 DIAGNOSIS — Z8719 Personal history of other diseases of the digestive system: Secondary | ICD-10-CM

## 2020-11-04 DIAGNOSIS — R194 Change in bowel habit: Secondary | ICD-10-CM

## 2020-11-04 DIAGNOSIS — R141 Gas pain: Secondary | ICD-10-CM

## 2020-11-04 DIAGNOSIS — R200 Anesthesia of skin: Secondary | ICD-10-CM | POA: Diagnosis present

## 2020-11-04 DIAGNOSIS — R55 Syncope and collapse: Secondary | ICD-10-CM | POA: Diagnosis not present

## 2020-11-04 DIAGNOSIS — K449 Diaphragmatic hernia without obstruction or gangrene: Secondary | ICD-10-CM | POA: Diagnosis present

## 2020-11-04 DIAGNOSIS — M549 Dorsalgia, unspecified: Secondary | ICD-10-CM | POA: Diagnosis not present

## 2020-11-04 DIAGNOSIS — M5136 Other intervertebral disc degeneration, lumbar region: Secondary | ICD-10-CM | POA: Diagnosis present

## 2020-11-04 DIAGNOSIS — Z800 Family history of malignant neoplasm of digestive organs: Secondary | ICD-10-CM

## 2020-11-04 DIAGNOSIS — Z681 Body mass index (BMI) 19 or less, adult: Secondary | ICD-10-CM

## 2020-11-04 DIAGNOSIS — A078 Other specified protozoal intestinal diseases: Secondary | ICD-10-CM

## 2020-11-04 DIAGNOSIS — R634 Abnormal weight loss: Secondary | ICD-10-CM | POA: Diagnosis present

## 2020-11-04 DIAGNOSIS — K222 Esophageal obstruction: Secondary | ICD-10-CM | POA: Diagnosis present

## 2020-11-04 DIAGNOSIS — M62838 Other muscle spasm: Secondary | ICD-10-CM | POA: Diagnosis present

## 2020-11-04 DIAGNOSIS — Z87442 Personal history of urinary calculi: Secondary | ICD-10-CM

## 2020-11-04 LAB — COMPREHENSIVE METABOLIC PANEL
ALT: 11 U/L (ref 0–44)
AST: 13 U/L — ABNORMAL LOW (ref 15–41)
Albumin: 3.6 g/dL (ref 3.5–5.0)
Alkaline Phosphatase: 95 U/L (ref 38–126)
Anion gap: 10 (ref 5–15)
BUN: 19 mg/dL (ref 6–20)
CO2: 26 mmol/L (ref 22–32)
Calcium: 9.1 mg/dL (ref 8.9–10.3)
Chloride: 98 mmol/L (ref 98–111)
Creatinine, Ser: 0.88 mg/dL (ref 0.61–1.24)
GFR, Estimated: >60 mL/min (ref >60)
Glucose, Bld: 93 mg/dL (ref 70–99)
Potassium: 3.9 mmol/L (ref 3.5–5.1)
Sodium: 134 mmol/L — ABNORMAL LOW (ref 135–145)
Total Bilirubin: 0.6 mg/dL (ref 0.3–1.2)
Total Protein: 6.4 g/dL — ABNORMAL LOW (ref 6.5–8.1)

## 2020-11-04 LAB — RETICULOCYTES
Immature Retic Fract: 3.7 % (ref 2.3–15.9)
RBC.: 4.54 MIL/uL (ref 4.22–5.81)
Retic Count, Absolute: 58.6 10*3/uL (ref 19.0–186.0)
Retic Ct Pct: 1.3 % (ref 0.4–3.1)

## 2020-11-04 LAB — CBC WITH DIFFERENTIAL/PLATELET
Abs Immature Granulocytes: 0.04 10*3/uL (ref 0.00–0.07)
Basophils Absolute: 0.1 10*3/uL (ref 0.0–0.1)
Basophils Relative: 1 %
Eosinophils Absolute: 0.1 10*3/uL (ref 0.0–0.5)
Eosinophils Relative: 2 %
HCT: 39.2 % (ref 39.0–52.0)
Hemoglobin: 13.8 g/dL (ref 13.0–17.0)
Immature Granulocytes: 1 %
Lymphocytes Relative: 24 %
Lymphs Abs: 1.6 10*3/uL (ref 0.7–4.0)
MCH: 30.1 pg (ref 26.0–34.0)
MCHC: 35.2 g/dL (ref 30.0–36.0)
MCV: 85.6 fL (ref 80.0–100.0)
Monocytes Absolute: 0.6 10*3/uL (ref 0.1–1.0)
Monocytes Relative: 9 %
Neutro Abs: 4.5 10*3/uL (ref 1.7–7.7)
Neutrophils Relative %: 63 %
Platelets: 241 10*3/uL (ref 150–400)
RBC: 4.58 MIL/uL (ref 4.22–5.81)
RDW: 11.9 % (ref 11.5–15.5)
WBC: 7 10*3/uL (ref 4.0–10.5)
nRBC: 0 % (ref 0.0–0.2)

## 2020-11-04 LAB — HEMOGLOBIN A1C
Hgb A1c MFr Bld: 5.2 % (ref 4.8–5.6)
Mean Plasma Glucose: 102.54 mg/dL

## 2020-11-04 LAB — FOLATE: Folate: 14.4 ng/mL (ref >5.9)

## 2020-11-04 LAB — D-DIMER, QUANTITATIVE: D-Dimer, Quant: <0.27 ug/mL-FEU (ref 0.00–0.50)

## 2020-11-04 LAB — URIC ACID: Uric Acid, Serum: 5.3 mg/dL (ref 3.7–8.6)

## 2020-11-04 LAB — IRON AND TIBC
Iron: 46 ug/dL (ref 45–182)
Saturation Ratios: 17 % — ABNORMAL LOW (ref 17.9–39.5)
TIBC: 267 ug/dL (ref 250–450)
UIBC: 221 ug/dL

## 2020-11-04 LAB — VITAMIN B12: Vitamin B-12: 231 pg/mL (ref 180–914)

## 2020-11-04 LAB — TSH: TSH: 2.681 u[IU]/mL (ref 0.350–4.500)

## 2020-11-04 LAB — FERRITIN: Ferritin: 138 ng/mL (ref 24–336)

## 2020-11-04 LAB — SARS CORONAVIRUS 2 (TAT 6-24 HRS): SARS Coronavirus 2: NEGATIVE

## 2020-11-04 LAB — HIV ANTIBODY (ROUTINE TESTING W REFLEX): HIV Screen 4th Generation wRfx: NONREACTIVE

## 2020-11-04 LAB — C-REACTIVE PROTEIN: CRP: 0.8 mg/dL (ref <1.0)

## 2020-11-04 LAB — BRAIN NATRIURETIC PEPTIDE: B Natriuretic Peptide: 28.7 pg/mL (ref 0.0–100.0)

## 2020-11-04 LAB — MAGNESIUM: Magnesium: 2.3 mg/dL (ref 1.7–2.4)

## 2020-11-04 LAB — SEDIMENTATION RATE: Sed Rate: 5 mm/hr (ref 0–16)

## 2020-11-04 LAB — OSMOLALITY: Osmolality: 291 mOsm/kg (ref 275–295)

## 2020-11-04 MED ORDER — METHOCARBAMOL 1000 MG/10ML IJ SOLN
500.0000 mg | Freq: Three times a day (TID) | INTRAVENOUS | Status: DC | PRN
  Filled 2020-11-04 (×2): qty 5

## 2020-11-04 MED ORDER — MORPHINE SULFATE (PF) 2 MG/ML IV SOLN
0.5000 mg | INTRAVENOUS | Status: DC | PRN
  Administered 2020-11-04 – 2020-11-06 (×4): 0.5 mg via INTRAVENOUS
  Filled 2020-11-04 (×4): qty 1

## 2020-11-04 MED ORDER — TRAMADOL HCL 50 MG PO TABS
50.0000 mg | ORAL_TABLET | Freq: Four times a day (QID) | ORAL | Status: DC | PRN
Start: 2020-11-04 — End: 2020-11-06
  Administered 2020-11-04 – 2020-11-06 (×2): 50 mg via ORAL
  Filled 2020-11-04 (×2): qty 1

## 2020-11-04 MED ORDER — POLYETHYLENE GLYCOL 3350 17 G PO PACK
17.0000 g | PACK | Freq: Two times a day (BID) | ORAL | Status: DC
  Filled 2020-11-04 (×2): qty 1

## 2020-11-04 MED ORDER — ONDANSETRON HCL 4 MG/2ML IJ SOLN: 4.0000 mg | Freq: Four times a day (QID) | INTRAMUSCULAR | Status: DC | PRN

## 2020-11-04 MED ORDER — CYANOCOBALAMIN 1000 MCG/ML IJ SOLN
1000.0000 ug | Freq: Every day | INTRAMUSCULAR | Status: DC
  Filled 2020-11-04: qty 1

## 2020-11-04 MED ORDER — DOCUSATE SODIUM 100 MG PO CAPS
100.0000 mg | ORAL_CAPSULE | Freq: Two times a day (BID) | ORAL | Status: DC
  Administered 2020-11-04 – 2020-11-05 (×2): 100 mg via ORAL
  Filled 2020-11-04 (×2): qty 1

## 2020-11-04 MED ORDER — TRAMADOL HCL 50 MG PO TABS: 50.0000 mg | ORAL_TABLET | Freq: Four times a day (QID) | ORAL | Status: DC | PRN

## 2020-11-04 MED ORDER — ACETAMINOPHEN 325 MG PO TABS: 650.0000 mg | ORAL_TABLET | Freq: Four times a day (QID) | ORAL | Status: DC | PRN

## 2020-11-04 MED ORDER — CYANOCOBALAMIN 1000 MCG/ML IJ SOLN
1000.0000 ug | Freq: Every day | INTRAMUSCULAR | Status: DC
  Administered 2020-11-04 – 2020-11-06 (×3): 1000 ug via SUBCUTANEOUS
  Filled 2020-11-04 (×3): qty 1

## 2020-11-04 MED ORDER — ONDANSETRON HCL 4 MG PO TABS: 4.0000 mg | ORAL_TABLET | Freq: Four times a day (QID) | ORAL | Status: DC | PRN

## 2020-11-04 MED ORDER — HEPARIN SODIUM (PORCINE) 5000 UNIT/ML IJ SOLN
5000.0000 [IU] | Freq: Three times a day (TID) | INTRAMUSCULAR | Status: DC
  Administered 2020-11-04 – 2020-11-05 (×4): 5000 [IU] via SUBCUTANEOUS
  Filled 2020-11-04 (×5): qty 1

## 2020-11-04 MED ORDER — LACTATED RINGERS IV SOLN: INTRAVENOUS | Status: DC

## 2020-11-04 MED ORDER — BISACODYL 5 MG PO TBEC: 5.0000 mg | DELAYED_RELEASE_TABLET | Freq: Every day | ORAL | Status: DC | PRN

## 2020-11-04 NOTE — Progress Notes (Signed)
Patient arrived via wheelchair from admitting office as a direct admit from Dr. Keturah Barre office.  MD in for admission H&P.  Introduced to staff and aware of unit routine.  Plan of care explained by MD.  Patient states has had back pain for quite some time and has recently lost a lot of weight.  Some general muscle atrophy and weakness is apparent.

## 2020-11-04 NOTE — H&P (Signed)
TRH H&P   Patient Demographics:    Dean Marquez, is a 60 y.o. male  MRN: [id number]   DOB - [birth year]  Admit Date - 11/04/2020  Outpatient Primary MD for the patient is Patient, No Pcp Per (Inactive)    CC - Back pain, weight loss, falls    HPI:    Dean Marquez  is a 60 y.o. male, with no significant past medical history except mid low back pain which has been now ongoing for 8 to 10 weeks radiating to the right lower extremity, generalized weakness and unintentional weight loss over 25 to 30 pounds, few falls at work with lightheadedness.  According to the patient he had developed mid to low back pain around 8 to 10 weeks ago while working in the yard, pain has gradually gotten worse along with multiple bouts of severe muscle spasms in the back, outpatient physiotherapy and supportive care have not helped much, at time pain radiates down the right thigh up to the knee, he also reports that he has had unintentional weight loss of about 25 to 30 pounds over the last few months, also at work he has become lightheaded and had a few falls.  He denies any fever chills, no headache, no chest pain or abdominal pain, no cough shortness of breath, no dysuria, no blood in stool or urine, no focal weakness that he can feel.    Review of systems:     A full 10 point Review of Systems was done, except as stated above, all other Review of Systems were negative.   With Past History of the following :    Past Medical History:  Diagnosis Date   Finger fracture, right    5th proximal phalanx   Kidney stones 2000      Past Surgical History:  Procedure Laterality Date   COLONOSCOPY  2014   KNEE ARTHROSCOPY Right    KNEE  ARTHROTOMY  2000   right   OPEN REDUCTION INTERNAL FIXATION (ORIF) PROXIMAL PHALANX Right 04/17/2017   Procedure: OPEN REDUCTION INTERNAL FIXATION (ORIF) PROXIMAL PHALANX;  Surgeon: Milly Jakob, MD;  Location: [place];  Service: Orthopedics;  Laterality: Right;      Social History:     Social History   Tobacco Use   Smoking status: Never   Smokeless tobacco: Never  Substance Use Topics   Alcohol use: Yes    Alcohol/week: 1.0 standard drink    Types: 1 Cans of beer per week    Comment: social         Family History :     Family History  Problem Relation Age of Onset   Colon cancer Father 110   Stomach cancer Neg Hx    Colon polyps Neg Hx    Esophageal cancer Neg Hx    Rectal cancer Neg Hx       Home Medications:   Prior to Admission medications   Medication Sig Start Date End Date Taking? Authorizing Provider  acetaminophen (TYLENOL) 325 MG tablet Take 2 tablets (650 mg total) by mouth every 6 (six) hours as needed for mild pain or moderate pain. Patient not taking: Reported on 07/31/2019 04/17/17   Milly Jakob, MD  ascorbic acid (VITAMIN C) 1000 MG tablet Take by mouth.    [provider]  b complex vitamins tablet Take by mouth.    [provider]  Cholecalciferol 125 MCG (5000 UT) TABS Take by mouth.    [provider]  Multiple Vitamins tablet Take by mouth.    [provider]     Allergies:    No Known Allergies   Physical Exam:   Vitals  Blood pressure 109/72, pulse 71, resp. rate 16, SpO2 97 %.   1. General Pleasant middle-aged Caucasian male lying in hospital bed in mild to moderate discomfort due to back pain, overall appears weak, with muscle wasting.  2. Normal affect and insight, Not Suicidal or Homicidal, Awake Alert,   3. No F.N deficits, ALL C.Nerves Intact, Strength 5/5 all 4 extremities, Sensation intact all 4 extremities, Plantars down going however right lower extremity is  comparatively weaker than the left, deep tendon reflexes are slightly more brisk in the right lower extremity.  4. Ears and Eyes appear Normal, Conjunctivae clear, PERRLA. Moist Oral Mucosa.  5. Supple Neck, No JVD, No cervical lymphadenopathy appriciated, No Carotid Bruits.  6. Symmetrical Chest wall movement, Good air movement bilaterally, CTAB.  7. RRR, No Gallops, Rubs or Murmurs, No Parasternal Heave.  8. Positive Bowel Sounds, Abdomen Soft, No tenderness, No organomegaly appriciated,No rebound -guarding or rigidity.  9.  No Cyanosis, Normal Skin Turgor, No Skin Rash or Bruise.  10.  Generalized muscle wasting,  joints appear normal , no effusions.  11. No Palpable Lymph Nodes in Neck or Axillae      Data Review:    CBC No results for input(s): WBC, HGB, HCT, PLT, MCV, MCH, MCHC, RDW, LYMPHSABS, MONOABS, EOSABS, BASOSABS, BANDABS in the last 168 hours.  Invalid input(s): NEUTRABS, BANDSABD ------------------------------------------------------------------------------------------------------------------  Chemistries  No results for input(s): NA, K, CL, CO2, GLUCOSE, BUN, CREATININE, CALCIUM, MG, AST, ALT, ALKPHOS, BILITOT in the last 168 hours.  Invalid input(s): GFRCGP ------------------------------------------------------------------------------------------------------------------ CrCl cannot be calculated (No successful lab value found.). ------------------------------------------------------------------------------------------------------------------ No results for input(s): TSH, T4TOTAL, T3FREE, THYROIDAB in the last 72 hours.  Invalid input(s): FREET3  Coagulation profile No results for input(s): INR, PROTIME in the last 168 hours. ------------------------------------------------------------------------------------------------------------------- No results for input(s): DDIMER in the last 72  hours. -------------------------------------------------------------------------------------------------------------------  Cardiac Enzymes No results for input(s): CKMB, TROPONINI, MYOGLOBIN in the last 168 hours.  Invalid input(s): CK ------------------------------------------------------------------------------------------------------------------ No results found for: BNP   ---------------------------------------------------------------------------------------------------------------  Urinalysis No results found for: COLORURINE, APPEARANCEUR, [place], [place], GLUCOSEU, HGBUR, BILIRUBINUR, KETONESUR, PROTEINUR, UROBILINOGEN, NITRITE, LEUKOCYTESUR  ----------------------------------------------------------------------------------------------------------------   Imaging Results:  No results found.    Assessment & Plan:     1.  Ongoing back pain with unintentional 25 to 30 pound weight loss, right lower extremity mild weakness on exam - at this time he has had extensive outpatient PT OT and supportive care which has not helped much, weight loss and right lower extremity minimal weakness are certainly concerning.  We will proceed with getting basic blood work including CBC, CMP, TSH, Anemia panel, CRP, ESR, UA, if renal function is stable will obtain CT chest abdomen pelvis to rule out malignancy for unintentional weight loss with family history of colon cancer, check stool for occult blood and ova parasites, will also check MRI T and L-spine for ongoing back pain with right lower extremity comparative weakness and multiple falls.  For now we will hydrate with IV fluids, PT OT as tolerated, pain control with oral and IV medications along with IV Robaxin for severe muscle spasms.  2.  Few unexplained falls.  No loss of consciousness, no by of bowel or bladder incontinence, check orthostatics, baseline EKG check echocardiogram to rule out any valvular abnormalities or septal  dilation.    DVT Prophylaxis Heparin   AM Labs Ordered, also please review Full Orders  Family Communication: Admission, patients condition and plan of care including tests being ordered have been discussed with the patient  who indicates understanding and agree with the plan and Code Status.  Code Status Full  Likely DC to  Home  Condition Fair  Consults called: None    Admission status: OBS   Time spent in minutes : 35   Lala Lund M.D on 11/04/2020 at 5:10 PM  To page go to [email or web address] - password [facility]

## 2020-11-05 ENCOUNTER — Observation Stay (HOSPITAL_COMMUNITY): Payer: 59

## 2020-11-05 DIAGNOSIS — M549 Dorsalgia, unspecified: Secondary | ICD-10-CM | POA: Diagnosis not present

## 2020-11-05 DIAGNOSIS — Z8719 Personal history of other diseases of the digestive system: Secondary | ICD-10-CM | POA: Diagnosis not present

## 2020-11-05 DIAGNOSIS — E43 Unspecified severe protein-calorie malnutrition: Secondary | ICD-10-CM | POA: Diagnosis present

## 2020-11-05 DIAGNOSIS — D122 Benign neoplasm of ascending colon: Secondary | ICD-10-CM | POA: Diagnosis present

## 2020-11-05 DIAGNOSIS — K449 Diaphragmatic hernia without obstruction or gangrene: Secondary | ICD-10-CM | POA: Diagnosis present

## 2020-11-05 DIAGNOSIS — I5031 Acute diastolic (congestive) heart failure: Secondary | ICD-10-CM | POA: Diagnosis not present

## 2020-11-05 DIAGNOSIS — Z79899 Other long term (current) drug therapy: Secondary | ICD-10-CM | POA: Diagnosis not present

## 2020-11-05 DIAGNOSIS — R140 Abdominal distension (gaseous): Secondary | ICD-10-CM

## 2020-11-05 DIAGNOSIS — A078 Other specified protozoal intestinal diseases: Secondary | ICD-10-CM | POA: Diagnosis not present

## 2020-11-05 DIAGNOSIS — R634 Abnormal weight loss: Secondary | ICD-10-CM | POA: Diagnosis present

## 2020-11-05 DIAGNOSIS — M5136 Other intervertebral disc degeneration, lumbar region: Secondary | ICD-10-CM | POA: Diagnosis present

## 2020-11-05 DIAGNOSIS — R194 Change in bowel habit: Secondary | ICD-10-CM | POA: Diagnosis present

## 2020-11-05 DIAGNOSIS — Z20822 Contact with and (suspected) exposure to covid-19: Secondary | ICD-10-CM | POA: Diagnosis present

## 2020-11-05 DIAGNOSIS — K222 Esophageal obstruction: Secondary | ICD-10-CM | POA: Diagnosis present

## 2020-11-05 DIAGNOSIS — R141 Gas pain: Secondary | ICD-10-CM

## 2020-11-05 DIAGNOSIS — R143 Flatulence: Secondary | ICD-10-CM | POA: Diagnosis not present

## 2020-11-05 DIAGNOSIS — Z87442 Personal history of urinary calculi: Secondary | ICD-10-CM | POA: Diagnosis not present

## 2020-11-05 DIAGNOSIS — K909 Intestinal malabsorption, unspecified: Secondary | ICD-10-CM | POA: Diagnosis present

## 2020-11-05 DIAGNOSIS — I959 Hypotension, unspecified: Secondary | ICD-10-CM | POA: Diagnosis present

## 2020-11-05 DIAGNOSIS — R200 Anesthesia of skin: Secondary | ICD-10-CM | POA: Diagnosis present

## 2020-11-05 DIAGNOSIS — R296 Repeated falls: Secondary | ICD-10-CM | POA: Diagnosis present

## 2020-11-05 DIAGNOSIS — Z681 Body mass index (BMI) 19 or less, adult: Secondary | ICD-10-CM | POA: Diagnosis not present

## 2020-11-05 DIAGNOSIS — Z800 Family history of malignant neoplasm of digestive organs: Secondary | ICD-10-CM | POA: Diagnosis not present

## 2020-11-05 DIAGNOSIS — M62838 Other muscle spasm: Secondary | ICD-10-CM | POA: Diagnosis present

## 2020-11-05 LAB — BASIC METABOLIC PANEL
Anion gap: 7 (ref 5–15)
BUN: 15 mg/dL (ref 6–20)
CO2: 29 mmol/L (ref 22–32)
Calcium: 9.2 mg/dL (ref 8.9–10.3)
Chloride: 101 mmol/L (ref 98–111)
Creatinine, Ser: 0.83 mg/dL (ref 0.61–1.24)
GFR, Estimated: >60 mL/min (ref >60)
Glucose, Bld: 91 mg/dL (ref 70–99)
Potassium: 4.2 mmol/L (ref 3.5–5.1)
Sodium: 137 mmol/L (ref 135–145)

## 2020-11-05 LAB — URINALYSIS, ROUTINE W REFLEX MICROSCOPIC
Bilirubin Urine: NEGATIVE
Glucose, UA: NEGATIVE mg/dL
Hgb urine dipstick: NEGATIVE
Ketones, ur: NEGATIVE mg/dL
Leukocytes,Ua: NEGATIVE
Nitrite: NEGATIVE
Protein, ur: NEGATIVE mg/dL
Specific Gravity, Urine: 1.02 (ref 1.005–1.030)
pH: 9 — ABNORMAL HIGH (ref 5.0–8.0)

## 2020-11-05 LAB — ECHOCARDIOGRAM COMPLETE
Area-P 1/2: 2.91 cm2
Calc EF: 58.5 %
Height: 73 in
S' Lateral: 3.7 cm
Single Plane A2C EF: 60.8 %
Single Plane A4C EF: 55.9 %
Weight: 2233.6 oz

## 2020-11-05 LAB — LIPID PANEL
Cholesterol: 126 mg/dL (ref 0–200)
HDL: 47 mg/dL (ref >40)
LDL Cholesterol: 70 mg/dL (ref 0–99)
Total CHOL/HDL Ratio: 2.7 RATIO
Triglycerides: 43 mg/dL (ref <150)
VLDL: 9 mg/dL (ref 0–40)

## 2020-11-05 LAB — SODIUM, URINE, RANDOM: Sodium, Ur: 135 mmol/L

## 2020-11-05 LAB — PROCALCITONIN: Procalcitonin: <0.1 ng/mL

## 2020-11-05 LAB — PROTIME-INR
INR: 1 (ref 0.8–1.2)
Prothrombin Time: 13.3 seconds (ref 11.4–15.2)

## 2020-11-05 LAB — OSMOLALITY, URINE: Osmolality, Ur: 753 mOsm/kg (ref 300–900)

## 2020-11-05 MED ORDER — LACTATED RINGERS IV SOLN: INTRAVENOUS | Status: AC

## 2020-11-05 MED ORDER — IOHEXOL 300 MG/ML  SOLN
100.0000 mL | Freq: Once | INTRAMUSCULAR | Status: AC | PRN
  Administered 2020-11-05: 100 mL via INTRAVENOUS

## 2020-11-05 MED ORDER — PROSOURCE PLUS PO LIQD
30.0000 mL | Freq: Two times a day (BID) | ORAL | Status: DC
  Administered 2020-11-05: 30 mL via ORAL
  Filled 2020-11-05: qty 30

## 2020-11-05 MED ORDER — PEG-KCL-NACL-NASULF-NA ASC-C 100 G PO SOLR
0.5000 | Freq: Once | ORAL | Status: AC
  Administered 2020-11-05: 100 g via ORAL
  Filled 2020-11-05: qty 1

## 2020-11-05 MED ORDER — PEG-KCL-NACL-NASULF-NA ASC-C 100 G PO SOLR
0.5000 | Freq: Once | ORAL | Status: AC
  Administered 2020-11-06: 100 g via ORAL
  Filled 2020-11-05: qty 1

## 2020-11-05 MED ORDER — IOHEXOL 9 MG/ML PO SOLN
ORAL | Status: AC
  Administered 2020-11-05: 500 mL
  Filled 2020-11-05: qty 1000

## 2020-11-05 MED ORDER — PEG-KCL-NACL-NASULF-NA ASC-C 100 G PO SOLR: 1.0000 | Freq: Once | ORAL | Status: DC

## 2020-11-05 NOTE — Anesthesia Preprocedure Evaluation (Addendum)
Anesthesia Evaluation  Patient identified by MRN, date of birth, ID band Patient awake    Reviewed: Allergy & Precautions, NPO status , Patient's Chart, lab work & pertinent test results  Airway Mallampati: II  TM Distance: >3 FB Neck ROM: Full    Dental no notable dental hx. (+) Teeth Intact, Dental Advisory Given   Pulmonary neg pulmonary ROS,    Pulmonary exam normal breath sounds clear to auscultation       Cardiovascular +CHF  negative cardio ROS Normal cardiovascular exam Rhythm:Regular Rate:Normal  11/05/20 echo 1. Left ventricular ejection fraction, by estimation, is 55 to 60%. The  left ventricle has normal function. The left ventricle has no regional  wall motion abnormalities. Left ventricular diastolic parameters were  normal.  2. Right ventricular systolic function is normal. The right ventricular  size is normal. There is normal pulmonary artery systolic pressure. The  estimated right ventricular systolic pressure is 60.6 mmHg.  3. The mitral valve is normal in structure. No evidence of mitral valve  regurgitation. No evidence of mitral stenosis.  4. The aortic valve is tricuspid. Aortic valve regurgitation is not  visualized. No aortic stenosis is present.  5. The inferior vena cava is normal in size with greater than 50%  respiratory variability, suggesting right atrial pressure of 3 mmHg.  6. Normal study.    Neuro/Psych negative neurological ROS  negative psych ROS   GI/Hepatic negative GI ROS, Neg liver ROS,   Endo/Other  negative endocrine ROS  Renal/GU Renal disease (hx of renal stone)     Musculoskeletal   Abdominal   Peds  Hematology   Anesthesia Other Findings BMI 18.42  Reproductive/Obstetrics                            Anesthesia Physical Anesthesia Plan  ASA: 3  Anesthesia Plan: MAC   Post-op Pain Management:    Induction:   PONV Risk Score and  Plan: Treatment may vary due to age or medical condition  Airway Management Planned: Natural Airway  Additional Equipment: None  Intra-op Plan:   Post-operative Plan:   Informed Consent: I have reviewed the patients History and Physical, chart, labs and discussed the procedure including the risks, benefits and alternatives for the proposed anesthesia with the patient or authorized representative who has indicated his/her understanding and acceptance.     Dental advisory given  Plan Discussed with:   Anesthesia Plan Comments: (Change in bowel habits and weight loss for EGD)       Anesthesia Quick Evaluation

## 2020-11-05 NOTE — Progress Notes (Signed)
  Echocardiogram 2D Echocardiogram has been performed.  Dean Marquez 11/05/2020, 11:28 AM

## 2020-11-05 NOTE — Evaluation (Signed)
Physical Therapy Evaluation Patient Details Name: Dean Marquez MRN: [id number] DOB: [birth year] Today's Date: 11/05/2020   History of Present Illness  Pt is a 60 year old man admitted on 11/04/20 with falls, lightheadness and unintentional weight loss of 20-30 lbs. Pt with recent hx of low back pain that radiates down his R LE, for which he has been going to OPPT in [place] where he resides x 1 month and has significantly interfered with ADL, mobility and ability to work. No significant PMH.  Clinical Impression   Pt admitted with above diagnosis. At baseline, pt is independent and active.  He has had back pain for 8 weeks that has been limiting mobility.  Reports some improvement with outpt PT but still painful. Additionally, developed weakness due to unexplained weight loss.  Today, pt able to ambulate 150' without AD but with labored effort and low gait speed.  Demonstrates good use of back precautions for pain control.  Pt lives in a multi level home and his job requires extensive walking - currently with significant limitations in these areas.  Pt also reports some dizziness with turning in bed and when laying down - discussed BPPV testing but he reports has improved over the past couple days and does not want to test today due to has too many other things to worry about between back pain and unexplained weight loss. Pt currently with functional limitations due to the deficits listed below (see PT Problem List). Pt will benefit from skilled PT to increase their independence and safety with mobility to allow discharge to the venue listed below.       Follow Up Recommendations  (continue outpt PT)    Equipment Recommendations  None recommended by PT    Recommendations for Other Services       Precautions / Restrictions Precautions Precautions: Fall Precaution Comments: hx of recent falls      Mobility  Bed Mobility Overal bed mobility: Modified Independent             General  bed mobility comments: Uses log roll technique    Transfers Overall transfer level: Needs assistance Equipment used: None Transfers: Sit to/from Stand Sit to Stand: Supervision         General transfer comment: wide base with hands on thigh during sit<>stand, increased time  Ambulation/Gait Ambulation/Gait assistance: Supervision Gait Distance (Feet): 150 Feet Assistive device: None Gait Pattern/deviations: Step-through pattern;Decreased stride length Gait velocity: decreased   General Gait Details: Very slow gait speed with easy fatigue due to pain  Stairs            Wheelchair Mobility    Modified Rankin (Stroke Patients Only)       Balance Overall balance assessment: Needs assistance Sitting-balance support: No upper extremity supported Sitting balance-Leahy Scale: Good     Standing balance support: No upper extremity supported Standing balance-Leahy Scale: Good                               Pertinent Vitals/Pain Pain Assessment: Faces Faces Pain Scale: Hurts little more Pain Location: back Pain Descriptors / Indicators: Guarding Pain Intervention(s): Limited activity within patient's tolerance;Monitored during session    Home Living Family/patient expects to be discharged to:: Private residence Living Arrangements: Children;Spouse/significant other Available Help at Discharge: Family;Available 24 hours/day (wife works from home) Type of Home: House Home Access: Stairs to enter Entrance Stairs-Rails: Right Entrance Stairs-Number of Steps: "a few" Home  Layout: Two level;1/2 bath on main level Home Equipment: None      Prior Function Level of Independence: Independent         Comments: Pt with 8 week hx of back pain and was workign with outpt PT - reports starting to improve. Pt works as MD     Journalist, newspaper        Extremity/Trunk Assessment   Upper Extremity Assessment Upper Extremity Assessment: Overall WFL for tasks  assessed    Lower Extremity Assessment Lower Extremity Assessment:  (Bil LE ROM WFL; did not MMT due to back pain but strength at least 3/5)    Cervical / Trunk Assessment Cervical / Trunk Assessment: Lordotic  Communication   Communication: No difficulties  Cognition Arousal/Alertness: Awake/alert Behavior During Therapy: WFL for tasks assessed/performed Overall Cognitive Status: Within Functional Limits for tasks assessed                                        General Comments General comments (skin integrity, edema, etc.):  Pt educated on PT role and POC.  He demonstrated his HEP from outpt PT including pelvic tilts, pelvic tilts with marching opposite foot on bed, piriformis stretch, and reports prone hip ext.  He reports that initially he was doing the exercises wrong and pain increasing, but has since corrected and was starting to show improvement.  Provided min cues with exercises today.  Also, pt asking about stretching - educated on light streching including piriformis in supine, hamstrings in supine, and hip flexors standing at counter.  Good adhearence to back precautions for comfort.  Did note lordosis and discussed maintaining pelvic tilt in standing.  Pt also reports dizziness when rolling in bed or laying down.  Reports has significantly improved.  Discussed possible BPPV testing but pt not want to pursue at this time due to symptoms improving and has a lot of other things he is focused on right now.  Did not BP's soft 100's or 90's/50's60's - pt reports baseline.    Exercises     Assessment/Plan    PT Assessment Patient needs continued PT services  PT Problem List Decreased strength;Decreased mobility;Decreased activity tolerance;Decreased balance;Pain       PT Treatment Interventions DME instruction;Therapeutic activities;Modalities;Gait training;Therapeutic exercise;Patient/family education;Stair training;Functional mobility training;Manual techniques     PT Goals (Current goals can be found in the Care Plan section)  Acute Rehab PT Goals Patient Stated Goal: return to work PT Goal Formulation: With patient Time For Goal Achievement: 11/19/20 Potential to Achieve Goals: Good    Frequency Min 3X/week   Barriers to discharge        Co-evaluation               AM-PAC PT "6 Clicks" Mobility  Outcome Measure Help needed turning from your back to your side while in a flat bed without using bedrails?: None Help needed moving from lying on your back to sitting on the side of a flat bed without using bedrails?: None Help needed moving to and from a bed to a chair (including a wheelchair)?: A Little Help needed standing up from a chair using your arms (e.g., wheelchair or bedside chair)?: A Little Help needed to walk in hospital room?: A Little Help needed climbing 3-5 steps with a railing? : A Little 6 Click Score: 20    End of Session Equipment Utilized During Treatment: Gait  belt Activity Tolerance: Patient tolerated treatment well Patient left: in bed;with call bell/phone within reach;with bed alarm set Nurse Communication: Mobility status PT Visit Diagnosis: Pain;Dizziness and giddiness (R42);Other abnormalities of gait and mobility (R26.89);Muscle weakness (generalized) (M62.81) Pain - part of body:  (back)    Time: [id number] PT Time Calculation (min) (ACUTE ONLY): 24 min   Charges:   PT Evaluation $PT Eval Low Complexity: 1 Low PT Treatments $Therapeutic Exercise: 8-22 mins        Abran Richard, PT Acute Rehab Services Pager [phone] [facility] [phone]   Karlton Lemon 11/05/2020, 3:28 PM

## 2020-11-05 NOTE — Consult Note (Addendum)
Consultation  Referring Provider: Dr. Candiss Norse Primary Care Physician:  Patient, No Pcp Per (Inactive) Primary Gastroenterologist: Dr. 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    Patient previously described that he had mid to low back pain around the past 8 to 10 weeks which started while working in the yard, patient has gradually gotten worse along with multiple bouts of severe muscle spasms in his back, he had seen outpatient physiotherapy and supportive care which has helped drastically per his words.  Currently the pain radiates down to the right thigh up to the knee.  Along with this reports he has had an unintentional weight loss of about 25 to 30 pounds over the past few months and has become lightheaded and had a few falls while at work.    Today, the patient discusses that he has been dealing with some back pain and has been going to physical therapy for this has been improving drastically but still has to step carefully and change positions slowly, but this is a great improvement over the past 8 to 10 weeks from where he started to "not even being able to roll over in bed".    Describes that he has always had some IBS for the past 20 years with occasional urgent loose stools and other times going 2 days in between a bowel movement, but about 4 to 5 months ago noted that he had some greasy looking stool and so he completely stopped eating, sugar, meats and gluten.  His stools gradually returned to normal.  Has also been taking some herbal supplement recently and tells me that actually for the  first time in his life he has had more regular bowel movements describing 2 solid "satisfying" stools a day over the past month or so.  Tells me that he also started back on a more "regular diet" over the past 3 weeks in an attempt to put on some weight as he initially had dropped from 163 down to 140, but can only seem to get up to 143 and then will fall back down again.  When asked about this "regular diet", he tells me he is still not eating fats, sweets, gluten but does occasionally have some meat now.  Associated symptoms include some early satiety    Denies fever, chills, headache, chest pain, abdominal pain, cough, shortness of breath, blood in his stool or urine or focal weakness.  Hospital course: 11/04/2020 MRI of the spine with marked endplate changes at [id number]-GRM which may be due to degenerative disc disease, though discitis-osteomyelitis is also a possibility, no paraspinal fluid collection or other secondary chronic infection, no spinal cord compression or spinal canal stenosis 11/05/2020 CT of the chest abdomen and pelvis with no definite findings in the chest, abdomen or pelvis to account for the patient's history of weight loss, small volume of ascites of uncertain etiology and significance, findings at L1-L2 may be related to advanced degenerative disc disease, also 2 subcentimeter liver lesions in the right  lobe of the liver which are too small to characterize  GI history: 07/31/2019 colonoscopy Dr. Hilarie Fredrickson: Normal, repeat recommended in 5 years given family history of colon cancer in his father 04/27/2012 colonoscopy Dr. Hilarie Fredrickson: 2 sessile polyps in the cecum and otherwise normal; pathology showed adenomatous  Past Medical History:  Diagnosis Date  . Finger fracture, right    5th proximal phalanx  . Kidney stones 2000    Past Surgical History:  Procedure Laterality Date  . COLONOSCOPY  2014  . KNEE ARTHROSCOPY Right   . KNEE ARTHROTOMY  2000   right  . OPEN REDUCTION INTERNAL FIXATION  (ORIF) PROXIMAL PHALANX Right 04/17/2017   Procedure: OPEN REDUCTION INTERNAL FIXATION (ORIF) PROXIMAL PHALANX;  Surgeon: Milly Jakob, MD;  Location: [facility];  Service: Orthopedics;  Laterality: Right;    Family History  Problem Relation Age of Onset  . Colon cancer Father 34  . Stomach cancer Neg Hx   . Colon polyps Neg Hx   . Esophageal cancer Neg Hx   . Rectal cancer Neg Hx     Social History   Tobacco Use  . Smoking status: Never  . Smokeless tobacco: Never  Vaping Use  . Vaping Use: Never used  Substance Use Topics  . Alcohol use: Yes    Alcohol/week: 1.0 standard drink    Types: 1 Cans of beer per week    Comment: social  . Drug use: No    Prior to Admission medications   Medication Sig Start Date End Date Taking? Authorizing Provider  ascorbic acid (VITAMIN C) 1000 MG tablet Take 500 mg by mouth daily.   Yes [provider]  b complex vitamins tablet Take 1 tablet by mouth daily.   Yes [provider]  Barberry-Oreg Grape-Goldenseal (BERBERINE COMPLEX PO) Take 1 tablet by mouth 3 (three) times daily.   Yes [provider]  Cholecalciferol 125 MCG (5000 UT) TABS Take 1 tablet by mouth daily.   Yes [provider]  Coenzyme Q10 (COQ10 PO) Take 1 tablet by mouth daily.   Yes [provider]  MAGNESIUM PO Take 1 tablet by mouth daily.   Yes [provider]  Misc Natural Products (ARTEMISIA PO) Take 1 tablet by mouth daily.   Yes [provider]  Multiple Vitamins tablet Take 1 tablet by mouth daily.   Yes [provider]    Current Facility-Administered Medications  Medication Dose Route Frequency Provider Last Rate Last Admin  . acetaminophen (TYLENOL) tablet 650 mg  650 mg Oral Q6H PRN Thurnell Lose, MD      . bisacodyl (DULCOLAX) EC tablet 5 mg  5 mg Oral Daily PRN Thurnell Lose, MD      . cyanocobalamin ((VITAMIN B-12)) injection 1,000 mcg  1,000 mcg Subcutaneous  Daily Thurnell Lose, MD   1,000 mcg at 11/04/20 2101  . docusate sodium (COLACE) capsule 100 mg  100 mg Oral BID Thurnell Lose, MD   100 mg at 11/05/20 0947  . heparin injection 5,000 Units  5,000 Units Subcutaneous Q8H Thurnell Lose, MD   5,000 Units at 11/05/20 0509  . lactated ringers infusion   Intravenous Continuous Thurnell Lose, MD 125 mL/hr at 11/05/20 0353 New Bag at 11/05/20 0353  . methocarbamol (ROBAXIN) 500 mg in dextrose 5 % 50 mL IVPB  500 mg Intravenous Q8H PRN Thurnell Lose, MD      . morphine 2 MG/ML injection 0.5 mg  0.5  mg Intravenous Q4H PRN Thurnell Lose, MD   0.5 mg at 11/05/20 0953  . ondansetron (ZOFRAN) injection 4 mg  4 mg Intravenous Q6H PRN Thurnell Lose, MD      . polyethylene glycol (MIRALAX / GLYCOLAX) packet 17 g  17 g Oral BID Thurnell Lose, MD      . traMADol Veatrice Bourbon) tablet 50 mg  50 mg Oral Q6H PRN Thurnell Lose, MD   50 mg at 11/04/20 1844    Allergies as of 11/04/2020  . (No Known Allergies)     Review of Systems:    Constitutional: No fever or chills Skin: No rash  Cardiovascular: No chest pain Respiratory: No SOB Gastrointestinal: See HPI and otherwise negative Genitourinary: No dysuria Neurological: +syncope Musculoskeletal: +back pain Hematologic: No bleeding  Psychiatric: No history of depression or anxiety    Physical Exam:  Vital signs in last 24 hours: Temp:  [97.9 F (36.6 C)-98.1 F (36.7 C)] 98.1 F (36.7 C) (08/04 0841) Pulse Rate:  [60-71] 60 (08/04 0841) Resp:  [15-18] 17 (08/04 0841) BP: (96-109)/(61-72) 103/68 (08/04 0841) SpO2:  [93 %-98 %] 98 % (08/04 0841) Weight:  [63.3 kg] 63.3 kg (08/03 1700) Last BM Date: 11/03/20 General:   Pleasant Caucasian male appears to be in NAD, Well developed, Well nourished, alert and cooperative Head:  Normocephalic and atraumatic. Eyes:   PEERL, EOMI. No icterus. Conjunctiva pink. Ears:  Normal auditory acuity. Neck:  Supple Throat: Oral cavity  and pharynx without inflammation, swelling or lesion.  Lungs: Respirations even and unlabored. Lungs clear to auscultation bilaterally.   No wheezes, crackles, or rhonchi.  Heart: Normal S1, S2. No MRG. Regular rate and rhythm. No peripheral edema, cyanosis or pallor.  Abdomen:  Soft, nondistended, nontender. No rebound or guarding. Normal bowel sounds. No appreciable masses or hepatomegaly. Rectal:  Not performed.  Msk:  Symmetrical without gross deformities. Peripheral pulses intact.  Extremities:  Without edema, no deformity or joint abnormality. +weakness b/l legs, unsteady, slowed gait Neurologic:  Alert and  oriented x4;  grossly normal neurologically.  Skin:   Dry and intact without significant lesions or rashes. Psychiatric: Demonstrates good judgement and reason without abnormal affect or behaviors.   LAB RESULTS: Recent Labs    11/04/20 1814  WBC 7.0  HGB 13.8  HCT 39.2  PLT 241   BMET Recent Labs    11/04/20 1814 11/05/20 0550  NA 134* 137  K 3.9 4.2  CL 98 101  CO2 26 29  GLUCOSE 93 91  BUN 19 15  CREATININE 0.88 0.83  CALCIUM 9.1 9.2   LFT Recent Labs    11/04/20 1814  PROT 6.4*  ALBUMIN 3.6  AST 13*  ALT 11  ALKPHOS 95  BILITOT 0.6   PT/INR Recent Labs    11/05/20 0550  LABPROT 13.3  INR 1.0    STUDIES: MR THORACIC SPINE WO CONTRAST  Result Date: 11/04/2020 CLINICAL DATA:  Back pain and possible cord compression EXAM: MRI THORACIC AND LUMBAR SPINE WITHOUT CONTRAST TECHNIQUE: Multiplanar and multiecho pulse sequences of the thoracic and lumbar spine were obtained without intravenous contrast. COMPARISON:  None. FINDINGS: MRI THORACIC SPINE FINDINGS Alignment:  Physiologic. Vertebrae: No fracture, evidence of discitis, or bone lesion. Cord:  Normal signal and morphology. Paraspinal and other soft tissues: Negative. Disc levels: T5-6: Small left subarticular disc protrusion without stenosis. The other levels are unremarkable. MRI LUMBAR SPINE  FINDINGS Segmentation:  Standard. Alignment:  Physiologic. Vertebrae: Hyperintense T2-weighted signal  and low T1-weighted signal within the endplates at the X33443 level. There is narrowing of disc space with mild hyperintense T2-weighted signal in the disc. No acute fracture. Conus medullaris and cauda equina: Conus extends to the L1-2 level. Conus and cauda equina appear normal. Paraspinal and other soft tissues: Negative. Disc levels: L1-L2: Disc space narrowing with endplate changes as above. No spinal canal stenosis. No neural foraminal stenosis. L2-L3: Normal disc space and facet joints. No spinal canal stenosis. No neural foraminal stenosis. L3-L4: Normal disc space and facet joints. No spinal canal stenosis. No neural foraminal stenosis. L4-L5: Normal disc space and facet joints. No spinal canal stenosis. No neural foraminal stenosis. L5-S1: Normal disc space and facet joints. No spinal canal stenosis. No neural foraminal stenosis. Visualized sacrum: Normal. IMPRESSION: 1. Marked endplate changes at L1-L2 may be due to degenerative disc disease, though diskitis-osteomyelitis is also a possibility. No paraspinal fluid collection or other secondary finding of infection. 2. No spinal cord compression or spinal canal stenosis. Electronically Signed   By: Ulyses Jarred M.D.   On: 11/04/2020 22:50   MR LUMBAR SPINE WO CONTRAST  Result Date: 11/04/2020 CLINICAL DATA:  Back pain and possible cord compression EXAM: MRI THORACIC AND LUMBAR SPINE WITHOUT CONTRAST TECHNIQUE: Multiplanar and multiecho pulse sequences of the thoracic and lumbar spine were obtained without intravenous contrast. COMPARISON:  None. FINDINGS: MRI THORACIC SPINE FINDINGS Alignment:  Physiologic. Vertebrae: No fracture, evidence of discitis, or bone lesion. Cord:  Normal signal and morphology. Paraspinal and other soft tissues: Negative. Disc levels: T5-6: Small left subarticular disc protrusion without stenosis. The other levels are  unremarkable. MRI LUMBAR SPINE FINDINGS Segmentation:  Standard. Alignment:  Physiologic. Vertebrae: Hyperintense T2-weighted signal and low T1-weighted signal within the endplates at the X33443 level. There is narrowing of disc space with mild hyperintense T2-weighted signal in the disc. No acute fracture. Conus medullaris and cauda equina: Conus extends to the L1-2 level. Conus and cauda equina appear normal. Paraspinal and other soft tissues: Negative. Disc levels: L1-L2: Disc space narrowing with endplate changes as above. No spinal canal stenosis. No neural foraminal stenosis. L2-L3: Normal disc space and facet joints. No spinal canal stenosis. No neural foraminal stenosis. L3-L4: Normal disc space and facet joints. No spinal canal stenosis. No neural foraminal stenosis. L4-L5: Normal disc space and facet joints. No spinal canal stenosis. No neural foraminal stenosis. L5-S1: Normal disc space and facet joints. No spinal canal stenosis. No neural foraminal stenosis. Visualized sacrum: Normal. IMPRESSION: 1. Marked endplate changes at L1-L2 may be due to degenerative disc disease, though diskitis-osteomyelitis is also a possibility. No paraspinal fluid collection or other secondary finding of infection. 2. No spinal cord compression or spinal canal stenosis. Electronically Signed   By: Ulyses Jarred M.D.   On: 11/04/2020 22:50   CT CHEST ABDOMEN PELVIS W CONTRAST  Result Date: 11/05/2020 CLINICAL DATA:  60 year old male with 25 pounds of unintended weight loss over the past 2-3 months. EXAM: CT CHEST, ABDOMEN, AND PELVIS WITH CONTRAST TECHNIQUE: Multidetector CT imaging of the chest, abdomen and pelvis was performed following the standard protocol during bolus administration of intravenous contrast. CONTRAST:  135m OMNIPAQUE IOHEXOL 300 MG/ML  SOLN COMPARISON:  No priors. FINDINGS: CT CHEST FINDINGS Cardiovascular: Heart size is normal. There is no significant pericardial fluid, thickening or pericardial  calcification. No atherosclerotic calcifications in the thoracic aorta or the coronary arteries. Mediastinum/Nodes: No pathologically enlarged mediastinal or hilar lymph nodes. Esophagus is unremarkable in appearance. No axillary lymphadenopathy. Lungs/Pleura:  Small calcified granuloma in the right lower lobe. No other suspicious appearing pulmonary nodules or masses are noted. No acute consolidative airspace disease. No pleural effusions. Mild pleuroparenchymal thickening and architectural distortion in the lung apices bilaterally, most compatible with areas of chronic post infectious or inflammatory scarring. Mild scarring also noted in the basal segments of the left lower lobe. Musculoskeletal: There are no aggressive appearing lytic or blastic lesions noted in the visualized portions of the skeleton. CT ABDOMEN PELVIS FINDINGS Hepatobiliary: There are 2 subcentimeter liver lesions in the right lobe of the liver in segment 5 (axial image 79 of series 4) and in segment 6 (axial image 82 of series 4) which are too small to characterize. No intra or extrahepatic biliary ductal dilatation. Gallbladder is normal in appearance. Pancreas: No pancreatic mass. No pancreatic ductal dilatation. No pancreatic or peripancreatic fluid collections or inflammatory changes. Spleen: Unremarkable. Adrenals/Urinary Tract: 1.4 cm low-attenuation lesion in the upper pole of the left kidney compatible with a simple cyst. Right kidney and bilateral adrenal glands are normal in appearance. No hydroureteronephrosis. Urinary bladder is normal in appearance. Stomach/Bowel: The appearance of the stomach is normal. There is no pathologic dilatation of small bowel or colon. Normal appendix. Vascular/Lymphatic: No significant atherosclerotic disease, aneurysm or dissection noted in the abdominal or pelvic vasculature. No lymphadenopathy noted in the abdomen or pelvis. Reproductive: Prostate gland and seminal vesicles are unremarkable in  appearance. Bilateral vasectomy clips are noted. Other: Small volume of ascites adjacent to the rectum. No pneumoperitoneum. Musculoskeletal: Advanced loss of disc height at L1-L2 with extensive areas of sclerosis and lucency in the adjacent vertebral body endplates. IMPRESSION: 1. No definite findings in the chest, abdomen or pelvis to account for the patient's history of weight loss. 2. Small volume of ascites is of uncertain etiology and significance. 3. Findings at L1-L2 may be related to advanced degenerative disc disease, although the possibility of discitis and osteomyelitis is not excluded. See report for MRI of the thoracolumbar spine dated 11/04/2020 for full description. 4. Additional incidental findings, as above. Electronically Signed   By: Vinnie Langton M.D.   On: 11/05/2020 09:29      Impression / Plan:   Impression: 1.  Unintentional weight loss: About 25 to 30 pounds over the past months, seemingly unable to gain weight; consider underlying malignancy versus drastic diet change worse other 2.  Low back and leg pain: Started after patient was doing an intense gardening project, now "drastic improvement" per him over the past 8 weeks with physical therapy 3.  Early satiety 4.  Change in bowel habits 5.  Greasy stools: About 4 to 5 months ago, patient drastically changed his diet and stopped eating sugars, fats, sweets or gluten, stools are now back to normal with 2 solid stools a day; consider element of pancreatic insufficiency  Plan: 1.  Discussed with patient that likely a large amount of his weight loss is due to drastic change in diet with no further sugar, fats, meats or gluten over the past 4 to 5 months. 2.  We will plan for EGD and colonoscopy tomorrow with Dr. Hilarie Fredrickson.  Did discuss risks, benefits,  limitations and alternatives and patient agrees to proceed. 3.  Patient will be on a clear liquid diet today and n.p.o. at midnight 4.  We will start movi prep this afternoon in  split dose fashion 5.  Added GI pathogen panel and O&P and will add pancreatic elastase  Thank you for your kind consultation, we  will continue to follow.  Lavone Nian Taylia Berber  11/05/2020, 10:18 AM

## 2020-11-05 NOTE — Evaluation (Signed)
Occupational Therapy Evaluation Patient Details Name: Dean Marquez MRN: [id number] DOB: February 22, 1961 Today's Date: 11/05/2020    History of Present Illness Pt is a 60 year old man admitted on 11/04/20 with falls, lightheadness and unintentional weight loss of 20-30 lbs. Pt with recent hx of low back pain that radiates down his R LE, for which he has been going to OPPT in [place] where he resides x 1 month and has significantly interfered with ADL, mobility and ability to work.   Clinical Impression   Pt has been modifying his life significantly since onset of increased back pain. Ambulation is slow and deliberate, he half kneels when documenting at work as sitting for extensive periods increases pain, leans in the shower or against a wall when reaching over head, and dons LB clothing in supine. Riding in the car/driving has been impossible at some points. Pt is able to stand with wide base and hands on thighs. He is most comfortable when walking with hands on his hips. He reports dizziness with bed mobility. Educated pt in back precautions for comfort and issued and instructed in use of AE for LB ADL. Instructed in fall prevention, ie:  sitting to shower and dress. Pt is highly motivated to return to work which requires extensive ambulation. Will follow acutely.     Follow Up Recommendations  No OT follow up    Equipment Recommendations   (recommended height adustable tub seat with back, pt will purchase on his own)    Recommendations for Other Services       Precautions / Restrictions Precautions Precautions: Fall Precaution Comments: hx of recent falls Restrictions Weight Bearing Restrictions: No      Mobility Bed Mobility Overal bed mobility: Modified Independent             General bed mobility comments: pt using log roll technique, reports dizziness with bed mobility    Transfers Overall transfer level: Needs assistance Equipment used: None Transfers: Sit to/from  Stand Sit to Stand: Supervision         General transfer comment: wide base with hands on thigh during sit<>stand, increased time    Balance Overall balance assessment: Needs assistance   Sitting balance-Leahy Scale: Good       Standing balance-Leahy Scale: Fair                             ADL either performed or assessed with clinical judgement   ADL Overall ADL's : Needs assistance/impaired Eating/Feeding: Independent   Grooming: Supervision/safety;Standing;Wash/dry hands   Upper Body Bathing: Supervision/ safety;Standing Upper Body Bathing Details (indicate cue type and reason): educated and provided long handled bath sponge for reaching back Lower Body Bathing: Minimal assistance Lower Body Bathing Details (indicate cue type and reason): issued and instructed in use of long handled bath sponge and reacher for washing and drying feet Upper Body Dressing : Supervision/safety;Standing Upper Body Dressing Details (indicate cue type and reason): pt with concerns of falling when his vision is occluded in standing when donning over head shirt Lower Body Dressing: Moderate assistance;Sit to/from stand Lower Body Dressing Details (indicate cue type and reason): pt unable to perform figure 4 to don socks, issued and educated in use of sock aid, reacher and long handled shoe horn Toilet Transfer: Supervision/safety;Ambulation Toilet Transfer Details (indicate cue type and reason): supervision for safety and lines Toileting- Clothing Manipulation and Hygiene: Modified independent       Functional mobility  during ADLs: Supervision/safety (for safety/lines) General ADL Comments: Instructed pt in back precautions for comfort and reinforced with written handout.     Vision Baseline Vision/History: Wears glasses Wears Glasses: Reading only Patient Visual Report: No change from baseline       Perception     Praxis      Pertinent Vitals/Pain Pain Assessment:  Faces Faces Pain Scale: Hurts little more Pain Location: back Pain Descriptors / Indicators: Guarding Pain Intervention(s): Monitored during session;Premedicated before session;Repositioned (pt received morphine prior to visit)     Hand Dominance Right   Extremity/Trunk Assessment Upper Extremity Assessment Upper Extremity Assessment: Overall WFL for tasks assessed   Lower Extremity Assessment Lower Extremity Assessment: Defer to PT evaluation   Cervical / Trunk Assessment Cervical / Trunk Assessment: Lordotic   Communication Communication Communication: No difficulties   Cognition Arousal/Alertness: Awake/alert Behavior During Therapy: WFL for tasks assessed/performed Overall Cognitive Status: Within Functional Limits for tasks assessed                                     General Comments       Exercises     Shoulder Instructions      Home Living Family/patient expects to be discharged to:: Private residence Living Arrangements: Children Available Help at Discharge: Family;Available 24 hours/day (wife works from home) Type of Home: House Home Access: Stairs to enter [employer] of Steps: "a few" Entrance Stairs-Rails: Right Home Layout: Two level;1/2 bath on main level Alternate Level Stairs-Number of Steps: flight   Bathroom Shower/Tub: Walk-in shower;Tub/shower unit (uses walk in shower)   Bathroom Toilet: Standard     Home Equipment: None          Prior Functioning/Environment Level of Independence: Independent        Comments: Pt has been modifying ambulation and ADL extensively due to back pain and imbalance.         OT Problem List: Impaired balance (sitting and/or standing);Pain;Decreased knowledge of use of DME or AE      OT Treatment/Interventions: Self-care/ADL training;DME and/or AE instruction;Patient/family education    OT Goals(Current goals can be found in the care plan section) Acute Rehab OT  Goals Patient Stated Goal: return to work OT Goal Formulation: With patient Time For Goal Achievement: 11/19/20 Potential to Achieve Goals: Good ADL Goals Additional ADL Goal #1: Pt will generalize back and fall precautions in ADL and mobility independently. Additional ADL Goal #2: Pt will be independent in use of AE for LB ADL.  OT Frequency: Min 2X/week   Barriers to D/C:            Co-evaluation              AM-PAC OT "6 Clicks" Daily Activity     Outcome Measure Help from another person eating meals?: None Help from another person taking care of personal grooming?: A Little Help from another person toileting, which includes using toliet, bedpan, or urinal?: A Little Help from another person bathing (including washing, rinsing, drying)?: A Lot Help from another person to put on and taking off regular upper body clothing?: A Little Help from another person to put on and taking off regular lower body clothing?: A Lot 6 Click Score: 17   End of Session Nurse Communication: Mobility status  Activity Tolerance: Patient tolerated treatment well Patient left: in chair;with call bell/phone within reach  OT Visit Diagnosis: Other abnormalities  of gait and mobility (R26.89);Pain;Unsteadiness on feet (R26.81);Dizziness and giddiness (R42)                Time: [id number] OT Time Calculation (min): 33 min Charges:  OT General Charges $OT Visit: 1 Visit OT Evaluation $OT Eval Low Complexity: 1 Low OT Treatments $Self Care/Home Management : 8-22 mins  Nestor Lewandowsky, OTR/L Acute Rehabilitation Services Pager: [phone] Office: [phone]  Dean Marquez 11/05/2020, 11:14 AM

## 2020-11-05 NOTE — Progress Notes (Signed)
PROGRESS NOTE                                                                                                                                                                                                             Patient Demographics:    Dean Marquez, is a 60 y.o. male, DOB - 31-May-1960, [id number]  Outpatient Primary MD for the patient is Patient, No Pcp Per (Inactive)    LOS - 0  Admit date - 11/04/2020    CC - Back pain, weight loss, falls       Brief Narrative (HPI from H&P) - Dean Marquez  is a 60 y.o. male, with no significant past medical history except mid low back pain which has been now ongoing for 8 to 10 weeks radiating to the right lower extremity, generalized weakness and unintentional weight loss over 25 to 30 pounds, few falls at work with lightheadedness.   According to the patient he had developed mid to low back pain around 8 to 10 weeks ago while working in the yard, pain has gradually gotten worse along with multiple bouts of severe muscle spasms in the back, outpatient physiotherapy and supportive care have not helped much, at time pain radiates down the right thigh up to the knee, he also reports that he has had unintentional weight loss of about 25 to 30 pounds over the last few months, also at work he has become lightheaded and had a few falls.   Subjective:    Dean Marquez today has, No headache, No chest pain, No abdominal pain - No Nausea, No new weakness tingling or numbness, no SOB, ++ low Back pain   Assessment  & Plan :    1.  Ongoing back pain with unintentional 25 to 30 pound weight loss, right lower extremity mild weakness on exam - so far imaging suggests severe L1-L2 endplate degeneration which could be the cause of his severe back pain and limited mobility.  Continue pain control with oral and IV medications, muscle relaxants, PT OT, will request neurosurgery to evaluate, do not  think he has an active discitis as no fever, no leukocytosis, stable procalcitonin and CRP, blood cultures so far negative, no preceding history of infections either.  Will let neurosurgery opine.  2.  Unintentional 25 to 30 pound weight loss.  Poor appetite, also  some history suggestive of early satiety, at his age imperative to do EGD colonoscopy to rule out malignancy.  GI consulted.  3.  Low B12.  Question malabsorption, placed on replacement  4.  Minimal nonspecific ascites.  Likely due to third spacing from low protein intake, encouraged on protein intake, GI to evaluate as well.  5.  Few unexplained falls.  Likely due to hypotension.  Hydrate with IV fluids, EKG labile, stable on telemetry, echo pending, hydrate with IV fluids and monitor  6.  Severe malnutrition.  BMI of 18.  Placed on protein supplementation, encouraged on liberal diet.  Will have dietitian see him.        Condition -  Fair  Family Communication  :  Wife  Code Status :  Full  Consults  :  N. Surg, GI  PUD Prophylaxis :    Procedures  :            Disposition Plan  :    Status is:  Inpt  Dispo: The patient is from: Home              Anticipated d/c is to: Home              Patient currently is not medically stable to d/c.   Difficult to place patient No    DVT Prophylaxis  :    heparin injection 5,000 Units Start: 11/04/20 2200    Lab Results  Component Value Date   PLT 241 11/04/2020    Diet :  Diet Order             Diet NPO time specified  Diet effective midnight           Diet clear liquid Room service appropriate? Yes; Fluid consistency: Thin  Diet effective now                    Inpatient Medications  Scheduled Meds:  cyanocobalamin  1,000 mcg Subcutaneous Daily   docusate sodium  100 mg Oral BID   heparin  5,000 Units Subcutaneous Q8H   polyethylene glycol  17 g Oral BID   Continuous Infusions:  lactated ringers     methocarbamol (ROBAXIN) IV     PRN  Meds:.acetaminophen, bisacodyl, methocarbamol (ROBAXIN) IV, morphine injection, [DISCONTINUED] ondansetron **OR** ondansetron (ZOFRAN) IV, traMADol  Antibiotics  :    Anti-infectives (From admission, onward)    None        Time Spent in minutes  30   Lala Lund M.D on 11/05/2020 at 1:31 PM  To page go to [email or web address]   [facility] -  Office  [phone]    See all Orders from today for further details    Objective:   Vitals:   11/04/20 2304 11/05/20 0551 11/05/20 0841 11/05/20 1208  BP: 97/69 96/66 103/68 92/63  Pulse: 64  60 60  Resp: '18 18 17 18  '$ Temp: 97.9 F (36.6 C) 98 F (36.7 C) 98.1 F (36.7 C) 97.8 F (36.6 C)  TempSrc: Oral Oral Oral Oral  SpO2: 94% 98% 98% 96%  Weight:      Height:        Wt Readings from Last 3 Encounters:  11/04/20 63.3 kg  07/31/19 78 kg  07/17/19 78 kg     Intake/Output Summary (Last 24 hours) at 11/05/2020 1331 Last data filed at 11/05/2020 1250 Gross per 24 hour  Intake 1172.13 ml  Output 1775 ml  Net -602.87 ml  Physical Exam  Awake Alert, No new F.N deficits,   [place].AT,PERRAL Supple Neck,No JVD, No cervical lymphadenopathy appriciated.  Symmetrical Chest wall movement, Good air movement bilaterally, CTAB RRR,No Gallops,Rubs or new Murmurs, No Parasternal Heave +ve B.Sounds, Abd Soft, No tenderness, No organomegaly appriciated, No rebound - guarding or rigidity. No Cyanosis, Clubbing or edema, No new Rash or bruise       Data Review:    CBC Recent Labs  Lab 11/04/20 1814  WBC 7.0  HGB 13.8  HCT 39.2  PLT 241  MCV 85.6  MCH 30.1  MCHC 35.2  RDW 11.9  LYMPHSABS 1.6  MONOABS 0.6  EOSABS 0.1  BASOSABS 0.1    Recent Labs  Lab 11/04/20 1814 11/05/20 0550 11/05/20 0733  NA 134* 137  --   K 3.9 4.2  --   CL 98 101  --   CO2 26 29  --   GLUCOSE 93 91  --   BUN 19 15  --   CREATININE 0.88 0.83  --   CALCIUM 9.1 9.2  --   AST 13*  --   --   ALT 11  --   --   ALKPHOS 95  --   --    BILITOT 0.6  --   --   ALBUMIN 3.6  --   --   MG 2.3  --   --   CRP 0.8  --   --   DDIMER <0.27  --   --   PROCALCITON  --   --  <0.10  INR  --  1.0  --   TSH 2.681  --   --   HGBA1C 5.2  --   --   BNP 28.7  --   --     ------------------------------------------------------------------------------------------------------------------ Recent Labs    11/05/20 0550  CHOL 126  HDL 47  LDLCALC 70  TRIG 43  CHOLHDL 2.7    Lab Results  Component Value Date   HGBA1C 5.2 11/04/2020   ------------------------------------------------------------------------------------------------------------------ Recent Labs    11/04/20 1814  TSH 2.681    Cardiac Enzymes No results for input(s): CKMB, TROPONINI, MYOGLOBIN in the last 168 hours.  Invalid input(s): CK ------------------------------------------------------------------------------------------------------------------    Component Value Date/Time   BNP 28.7 11/04/2020 1814    Micro Results Recent Results (from the past 240 hour(s))  SARS CORONAVIRUS 2 (TAT 6-24 HRS) Nasopharyngeal Nasopharyngeal Swab     Status: None   Collection Time: 11/04/20  5:34 PM   Specimen: Nasopharyngeal Swab  Result Value Ref Range Status   SARS Coronavirus 2 NEGATIVE NEGATIVE Final    Comment: (NOTE) SARS-CoV-2 target nucleic acids are NOT DETECTED.  The SARS-CoV-2 RNA is generally detectable in upper and lower respiratory specimens during the acute phase of infection. Negative results do not preclude SARS-CoV-2 infection, do not rule out co-infections with other pathogens, and should not be used as the sole basis for treatment or other patient management decisions. Negative results must be combined with clinical observations, patient history, and epidemiological information. The expected result is Negative.  Fact Sheet for Patients: SugarRoll.be  Fact Sheet for Healthcare  Providers: https://www.woods-mathews.com/  This test is not yet approved or cleared by the [place] FDA and  has been authorized for detection and/or diagnosis of SARS-CoV-2 by FDA under an Emergency Use Authorization (EUA). This EUA will remain  in effect (meaning this test can be used) for the duration of the COVID-19 declaration under Se ction 564(b)(1) of the Act, 21  U.S.C. section 360bbb-3(b)(1), unless the authorization is terminated or revoked sooner.  Performed at [facility], [place] [address], [place], [place] [address]     Radiology Reports MR THORACIC SPINE WO CONTRAST  Result Date: 11/04/2020 CLINICAL DATA:  Back pain and possible cord compression EXAM: MRI THORACIC AND LUMBAR SPINE WITHOUT CONTRAST TECHNIQUE: Multiplanar and multiecho pulse sequences of the thoracic and lumbar spine were obtained without intravenous contrast. COMPARISON:  None. FINDINGS: MRI THORACIC SPINE FINDINGS Alignment:  Physiologic. Vertebrae: No fracture, evidence of discitis, or bone lesion. Cord:  Normal signal and morphology. Paraspinal and other soft tissues: Negative. Disc levels: T5-6: Small left subarticular disc protrusion without stenosis. The other levels are unremarkable. MRI LUMBAR SPINE FINDINGS Segmentation:  Standard. Alignment:  Physiologic. Vertebrae: Hyperintense T2-weighted signal and low T1-weighted signal within the endplates at the [id number] level. There is narrowing of disc space with mild hyperintense T2-weighted signal in the disc. No acute fracture. Conus medullaris and cauda equina: Conus extends to the L1-2 level. Conus and cauda equina appear normal. Paraspinal and other soft tissues: Negative. Disc levels: L1-L2: Disc space narrowing with endplate changes as above. No spinal canal stenosis. No neural foraminal stenosis. L2-L3: Normal disc space and facet joints. No spinal canal stenosis. No neural foraminal stenosis. L3-L4: Normal disc space and facet joints. No  spinal canal stenosis. No neural foraminal stenosis. L4-L5: Normal disc space and facet joints. No spinal canal stenosis. No neural foraminal stenosis. L5-S1: Normal disc space and facet joints. No spinal canal stenosis. No neural foraminal stenosis. Visualized sacrum: Normal. IMPRESSION: 1. Marked endplate changes at L1-L2 may be due to degenerative disc disease, though diskitis-osteomyelitis is also a possibility. No paraspinal fluid collection or other secondary finding of infection. 2. No spinal cord compression or spinal canal stenosis. Electronically Signed   By: Ulyses Jarred M.D.   On: 11/04/2020 22:50   MR LUMBAR SPINE WO CONTRAST  Result Date: 11/04/2020 CLINICAL DATA:  Back pain and possible cord compression EXAM: MRI THORACIC AND LUMBAR SPINE WITHOUT CONTRAST TECHNIQUE: Multiplanar and multiecho pulse sequences of the thoracic and lumbar spine were obtained without intravenous contrast. COMPARISON:  None. FINDINGS: MRI THORACIC SPINE FINDINGS Alignment:  Physiologic. Vertebrae: No fracture, evidence of discitis, or bone lesion. Cord:  Normal signal and morphology. Paraspinal and other soft tissues: Negative. Disc levels: T5-6: Small left subarticular disc protrusion without stenosis. The other levels are unremarkable. MRI LUMBAR SPINE FINDINGS Segmentation:  Standard. Alignment:  Physiologic. Vertebrae: Hyperintense T2-weighted signal and low T1-weighted signal within the endplates at the X33443 level. There is narrowing of disc space with mild hyperintense T2-weighted signal in the disc. No acute fracture. Conus medullaris and cauda equina: Conus extends to the L1-2 level. Conus and cauda equina appear normal. Paraspinal and other soft tissues: Negative. Disc levels: L1-L2: Disc space narrowing with endplate changes as above. No spinal canal stenosis. No neural foraminal stenosis. L2-L3: Normal disc space and facet joints. No spinal canal stenosis. No neural foraminal stenosis. L3-L4: Normal disc space  and facet joints. No spinal canal stenosis. No neural foraminal stenosis. L4-L5: Normal disc space and facet joints. No spinal canal stenosis. No neural foraminal stenosis. L5-S1: Normal disc space and facet joints. No spinal canal stenosis. No neural foraminal stenosis. Visualized sacrum: Normal. IMPRESSION: 1. Marked endplate changes at L1-L2 may be due to degenerative disc disease, though diskitis-osteomyelitis is also a possibility. No paraspinal fluid collection or other secondary finding of infection. 2. No spinal cord compression or spinal canal  stenosis. Electronically Signed   By: Ulyses Jarred M.D.   On: 11/04/2020 22:50   CT CHEST ABDOMEN PELVIS W CONTRAST  Result Date: 11/05/2020 CLINICAL DATA:  60 year old male with 25 pounds of unintended weight loss over the past 2-3 months. EXAM: CT CHEST, ABDOMEN, AND PELVIS WITH CONTRAST TECHNIQUE: Multidetector CT imaging of the chest, abdomen and pelvis was performed following the standard protocol during bolus administration of intravenous contrast. CONTRAST:  117m OMNIPAQUE IOHEXOL 300 MG/ML  SOLN COMPARISON:  No priors. FINDINGS: CT CHEST FINDINGS Cardiovascular: Heart size is normal. There is no significant pericardial fluid, thickening or pericardial calcification. No atherosclerotic calcifications in the thoracic aorta or the coronary arteries. Mediastinum/Nodes: No pathologically enlarged mediastinal or hilar lymph nodes. Esophagus is unremarkable in appearance. No axillary lymphadenopathy. Lungs/Pleura: Small calcified granuloma in the right lower lobe. No other suspicious appearing pulmonary nodules or masses are noted. No acute consolidative airspace disease. No pleural effusions. Mild pleuroparenchymal thickening and architectural distortion in the lung apices bilaterally, most compatible with areas of chronic post infectious or inflammatory scarring. Mild scarring also noted in the basal segments of the left lower lobe. Musculoskeletal: There are  no aggressive appearing lytic or blastic lesions noted in the visualized portions of the skeleton. CT ABDOMEN PELVIS FINDINGS Hepatobiliary: There are 2 subcentimeter liver lesions in the right lobe of the liver in segment 5 (axial image 79 of series 4) and in segment 6 (axial image 82 of series 4) which are too small to characterize. No intra or extrahepatic biliary ductal dilatation. Gallbladder is normal in appearance. Pancreas: No pancreatic mass. No pancreatic ductal dilatation. No pancreatic or peripancreatic fluid collections or inflammatory changes. Spleen: Unremarkable. Adrenals/Urinary Tract: 1.4 cm low-attenuation lesion in the upper pole of the left kidney compatible with a simple cyst. Right kidney and bilateral adrenal glands are normal in appearance. No hydroureteronephrosis. Urinary bladder is normal in appearance. Stomach/Bowel: The appearance of the stomach is normal. There is no pathologic dilatation of small bowel or colon. Normal appendix. Vascular/Lymphatic: No significant atherosclerotic disease, aneurysm or dissection noted in the abdominal or pelvic vasculature. No lymphadenopathy noted in the abdomen or pelvis. Reproductive: Prostate gland and seminal vesicles are unremarkable in appearance. Bilateral vasectomy clips are noted. Other: Small volume of ascites adjacent to the rectum. No pneumoperitoneum. Musculoskeletal: Advanced loss of disc height at L1-L2 with extensive areas of sclerosis and lucency in the adjacent vertebral body endplates. IMPRESSION: 1. No definite findings in the chest, abdomen or pelvis to account for the patient's history of weight loss. 2. Small volume of ascites is of uncertain etiology and significance. 3. Findings at L1-L2 may be related to advanced degenerative disc disease, although the possibility of discitis and osteomyelitis is not excluded. See report for MRI of the thoracolumbar spine dated 11/04/2020 for full description. 4. Additional incidental  findings, as above. Electronically Signed   By: DVinnie LangtonM.D.   On: 11/05/2020 09:29

## 2020-11-05 NOTE — Consult Note (Addendum)
Reason for Consult: Back pain Referring Physician: Thurnell Lose, MD    HPI: Dean Marquez is a 60 y.o. male with no significant past medical history except low back pain with an onset of approximately 8 to 10 weeks ago. He denies any traumatic or precipitating events but does report that he was doing yard work around the time of symptom onset. He presented to the hospital on 11/04/2020 for evaluation due to an unintentional weight loss over 25 to 30 pounds over the last few months, lightheadedness, falls, and progressively worsening low back pain. He denies any radicular pain, but does have intermittent paresthesia in his right lateral thigh and severe muscle spasms in his lumbosacral region. He has done PT which has offered some relief but his symptoms continue to be severe. He denies any lower extremity weakness, incontinence, and saddle anesthesia.   Past Medical History:  Diagnosis Date   Finger fracture, right    5th proximal phalanx   Kidney stones 2000    Past Surgical History:  Procedure Laterality Date   COLONOSCOPY  2014   KNEE ARTHROSCOPY Right    KNEE ARTHROTOMY  2000   right   OPEN REDUCTION INTERNAL FIXATION (ORIF) PROXIMAL PHALANX Right 04/17/2017   Procedure: OPEN REDUCTION INTERNAL FIXATION (ORIF) PROXIMAL PHALANX;  Surgeon: Milly Jakob, MD;  Location: [facility];  Service: Orthopedics;  Laterality: Right;    Family History  Problem Relation Age of Onset   Colon cancer Father 41   Stomach cancer Neg Hx    Colon polyps Neg Hx    Esophageal cancer Neg Hx    Rectal cancer Neg Hx     Social History:  reports that he has never smoked. He has never used smokeless tobacco. He reports current alcohol use of about 1.0 standard drink of alcohol per week. He reports that he does not use drugs.  Allergies: No Known Allergies  Medications: I have reviewed the patient's current medications.  Results for orders placed or performed during the hospital  encounter of 11/04/20 (from the past 48 hour(s))  SARS CORONAVIRUS 2 (TAT 6-24 HRS) Nasopharyngeal Nasopharyngeal Swab     Status: None   Collection Time: 11/04/20  5:34 PM   Specimen: Nasopharyngeal Swab  Result Value Ref Range   SARS Coronavirus 2 NEGATIVE NEGATIVE    Comment: (NOTE) SARS-CoV-2 target nucleic acids are NOT DETECTED.  The SARS-CoV-2 RNA is generally detectable in upper and lower respiratory specimens during the acute phase of infection. Negative results do not preclude SARS-CoV-2 infection, do not rule out co-infections with other pathogens, and should not be used as the sole basis for treatment or other patient management decisions. Negative results must be combined with clinical observations, patient history, and epidemiological information. The expected result is Negative.  Fact Sheet for Patients: SugarRoll.be  Fact Sheet for Healthcare Providers: https://www.woods-mathews.com/  This test is not yet approved or cleared by the [place] FDA and  has been authorized for detection and/or diagnosis of SARS-CoV-2 by FDA under an Emergency Use Authorization (EUA). This EUA will remain  in effect (meaning this test can be used) for the duration of the COVID-19 declaration under Se ction 564(b)(1) of the Act, 21 U.S.C. section 360bbb-3(b)(1), unless the authorization is terminated or revoked sooner.  Performed at [facility], [place] [address] [place], [place] [address]   D-dimer, quantitative     Status: None   Collection Time: 11/04/20  6:14 PM  Result Value Ref Range  D-Dimer, Quant <0.27 0.00 - 0.50 ug/mL-FEU    Comment: (NOTE) At the manufacturer cut-off value of 0.5 g/mL FEU, this assay has a negative predictive value of 95-100%.This assay is intended for use in conjunction with a clinical pretest probability (PTP) assessment model to exclude pulmonary embolism (PE) and deep venous thrombosis (DVT) in  outpatients suspected of PE or DVT. Results should be correlated with clinical presentation. Performed at [facility], [place] [address], [place], [place] [address]   Comprehensive metabolic panel     Status: Abnormal   Collection Time: 11/04/20  6:14 PM  Result Value Ref Range   Sodium 134 (L) 135 - 145 mmol/L   Potassium 3.9 3.5 - 5.1 mmol/L   Chloride 98 98 - 111 mmol/L   CO2 26 22 - 32 mmol/L   Glucose, Bld 93 70 - 99 mg/dL    Comment: Glucose reference range applies only to samples taken after fasting for at least 8 hours.   BUN 19 6 - 20 mg/dL   Creatinine, Ser 0.88 0.61 - 1.24 mg/dL   Calcium 9.1 8.9 - 10.3 mg/dL   Total Protein 6.4 (L) 6.5 - 8.1 g/dL   Albumin 3.6 3.5 - 5.0 g/dL   AST 13 (L) 15 - 41 U/L   ALT 11 0 - 44 U/L   Alkaline Phosphatase 95 38 - 126 U/L   Total Bilirubin 0.6 0.3 - 1.2 mg/dL   GFR, Estimated >60 >60 mL/min    Comment: (NOTE) Calculated using the CKD-EPI Creatinine Equation (2021)    Anion gap 10 5 - 15    Comment: Performed at [facility], [place] [address] [place], [place] [address]  Brain natriuretic peptide     Status: None   Collection Time: 11/04/20  6:14 PM  Result Value Ref Range   B Natriuretic Peptide 28.7 0.0 - 100.0 pg/mL    Comment: Performed at [facility] [address], [place], [place] [address]  Hemoglobin A1c     Status: None   Collection Time: 11/04/20  6:14 PM  Result Value Ref Range   Hgb A1c MFr Bld 5.2 4.8 - 5.6 %    Comment: (NOTE) Pre diabetes:          5.7%-6.4%  Diabetes:              >6.4%  Glycemic control for   <7.0% adults with diabetes    Mean Plasma Glucose 102.54 mg/dL    Comment: Performed at [facility] [address], [place], [place] [address]  Magnesium     Status: None   Collection Time: 11/04/20  6:14 PM  Result Value Ref Range   Magnesium 2.3 1.7 - 2.4 mg/dL    Comment: Performed at [facility], [place] [address] [place], [place] [address]  Vitamin B12      Status: None   Collection Time: 11/04/20  6:14 PM  Result Value Ref Range   Vitamin B-12 231 180 - 914 pg/mL    Comment: (NOTE) This assay is not validated for testing neonatal or myeloproliferative syndrome specimens for Vitamin B12 levels. Performed at [facility], [place] [address] [place], [place] [address]   Folate     Status: None   Collection Time: 11/04/20  6:14 PM  Result Value Ref Range   Folate 14.4 >5.9 ng/mL    Comment: Performed at [facility] [address], [place],  [address]  Iron and TIBC     Status: Abnormal   Collection Time: 11/04/20  6:14 PM  Result Value Ref Range   Iron 46 45 - 182 ug/dL   TIBC 267 250 - 450 ug/dL   Saturation Ratios 17 (L) 17.9 - 39.5 %   UIBC 221 ug/dL    Comment: Performed at [facility] [address], [place], [place] [address]  Ferritin     Status: None   Collection Time: 11/04/20  6:14 PM  Result Value Ref Range   Ferritin 138 24 - 336 ng/mL    Comment: Performed at [facility], [place]  [address], [place], [place] [address]  Reticulocytes     Status: None   Collection Time: 11/04/20  6:14 PM  Result Value Ref Range   Retic Ct Pct 1.3 0.4 - 3.1 %   RBC. 4.54 4.22 - 5.81 MIL/uL   Retic Count, Absolute 58.6 19.0 - 186.0 K/uL   Immature Retic Fract 3.7 2.3 - 15.9 %    Comment: Performed at [facility] [address] [place], [place] [address]  C-reactive protein     Status: None   Collection Time: 11/04/20  6:14 PM  Result Value Ref Range   CRP 0.8 <1.0 mg/dL    Comment: Performed at [facility], [place] [address], [place], [place] [address]  Sedimentation rate     Status: None   Collection Time: 11/04/20  6:14 PM  Result Value Ref Range   Sed Rate 5 0 - 16 mm/hr    Comment: Performed at [facility] [address] [place], [place] [address]  HIV Antibody (routine testing w rflx)     Status: None   Collection Time: 11/04/20  6:14 PM  Result Value Ref Range   HIV Screen  4th Generation wRfx Non Reactive Non Reactive    Comment: Performed at [facility], [place] [address] [place], [place] [address]  CBC with Differential/Platelet     Status: None   Collection Time: 11/04/20  6:14 PM  Result Value Ref Range   WBC 7.0 4.0 - 10.5 K/uL   RBC 4.58 4.22 - 5.81 MIL/uL   Hemoglobin 13.8 13.0 - 17.0 g/dL   HCT 39.2 39.0 - 52.0 %   MCV 85.6 80.0 - 100.0 fL   MCH 30.1 26.0 - 34.0 pg   MCHC 35.2 30.0 - 36.0 g/dL   RDW 11.9 11.5 - 15.5 %   Platelets 241 150 - 400 K/uL   nRBC 0.0 0.0 - 0.2 %   Neutrophils Relative % 63 %   Neutro Abs 4.5 1.7 - 7.7 K/uL   Lymphocytes Relative 24 %   Lymphs Abs 1.6 0.7 - 4.0 K/uL   Monocytes Relative 9 %   Monocytes Absolute 0.6 0.1 - 1.0 K/uL   Eosinophils Relative 2 %   Eosinophils Absolute 0.1 0.0 - 0.5 K/uL   Basophils Relative 1 %   Basophils Absolute 0.1 0.0 - 0.1 K/uL   Immature Granulocytes 1 %   Abs Immature Granulocytes 0.04 0.00 - 0.07 K/uL    Comment: Performed at [facility], [address], [place], [place] [address]  TSH     Status: None   Collection Time: 11/04/20  6:14 PM  Result Value Ref Range   TSH 2.681 0.350 - 4.500 uIU/mL    Comment: Performed by a 3rd Generation assay with a functional sensitivity of <=0.01 uIU/mL. Performed at  [facility], [place] [address] [place], [place] [address]   Osmolality     Status: None   Collection Time: 11/04/20  6:14 PM  Result Value Ref Range   Osmolality 291 275 - 295 mOsm/kg    Comment: Performed at [facility], [place] [address], [place], [place] [address]  Uric acid     Status: None   Collection Time: 11/04/20  6:14 PM  Result Value Ref Range   Uric Acid, Serum 5.3 3.7 - 8.6 mg/dL    Comment: Performed at [facility], [place] [address], [place], [place] [address]  Sodium, urine, random     Status: None   Collection Time: 11/04/20  8:09 PM  Result Value Ref Range   Sodium, Ur 135 mmol/L    Comment: Performed at [facility] [address], [place], [place] [address]  Urinalysis, Routine w reflex microscopic Urine, Clean Catch     Status: Abnormal   Collection Time: 11/04/20  8:10 PM  Result Value Ref Range   Color, Urine AMBER (A) YELLOW    Comment: BIOCHEMICALS MAY BE AFFECTED BY COLOR   APPearance CLOUDY (A) CLEAR   Specific Gravity, Urine 1.020 1.005 - 1.030   pH 9.0 (H) 5.0 - 8.0   Glucose, UA NEGATIVE NEGATIVE mg/dL   Hgb urine dipstick NEGATIVE NEGATIVE   Bilirubin Urine NEGATIVE NEGATIVE   Ketones, ur NEGATIVE NEGATIVE mg/dL   Protein, ur NEGATIVE NEGATIVE mg/dL   Nitrite NEGATIVE NEGATIVE   Leukocytes,Ua NEGATIVE NEGATIVE    Comment: Performed at [facility] [address] [place], [place] [address]  Osmolality, urine     Status: None   Collection Time: 11/04/20  9:00 PM  Result Value Ref Range   Osmolality, Ur 753 300 - 900 mOsm/kg    Comment: Performed at [facility] [address] [place], [place] [address]  Lipid panel     Status: None   Collection Time: 11/05/20  5:50 AM  Result Value Ref Range   Cholesterol 126 0 - 200 mg/dL   Triglycerides 43 <150 mg/dL   HDL 47 >40 mg/dL   Total CHOL/HDL Ratio 2.7 RATIO   VLDL 9 0 - 40 mg/dL   LDL Cholesterol 70 0 - 99 mg/dL    Comment:        Total Cholesterol/HDL:CHD Risk Coronary Heart Disease Risk Table                     Men   Women  1/2 Average Risk   3.4   3.3  Average Risk       5.0   4.4  2 X Average Risk   9.6   7.1  3 X Average Risk  23.4   11.0        Use the calculated Patient Ratio above and the CHD Risk Table to determine the patient's CHD Risk.        ATP III CLASSIFICATION (LDL):  <100     mg/dL   Optimal  100-129  mg/dL   Near or Above                    Optimal  130-159  mg/dL   Borderline  160-189  mg/dL   High  >190     mg/dL   Very High Performed at [facility] [address], [place], [place] [address]   Protime-INR  Status: None   Collection Time: 11/05/20  5:50 AM  Result Value Ref  Range   Prothrombin Time 13.3 11.4 - 15.2 seconds   INR 1.0 0.8 - 1.2    Comment: (NOTE) INR goal varies based on device and disease states. Performed at [facility], [place] [address] [place], [place] [id number]   Basic metabolic panel     Status: None   Collection Time: 11/05/20  5:50 AM  Result Value Ref Range   Sodium 137 135 - 145 mmol/L   Potassium 4.2 3.5 - 5.1 mmol/L   Chloride 101 98 - 111 mmol/L   CO2 29 22 - 32 mmol/L   Glucose, Bld 91 70 - 99 mg/dL    Comment: Glucose reference range applies only to samples taken after fasting for at least 8 hours.   BUN 15 6 - 20 mg/dL   Creatinine, Ser 0.83 0.61 - 1.24 mg/dL   Calcium 9.2 8.9 - 10.3 mg/dL   GFR, Estimated >60 >60 mL/min    Comment: (NOTE) Calculated using the CKD-EPI Creatinine Equation (2021)    Anion gap 7 5 - 15    Comment: Performed at [facility] [address], [place], [place] [address]  Procalcitonin - Baseline     Status: None   Collection Time: 11/05/20  7:33 AM  Result Value Ref Range   Procalcitonin <0.10 ng/mL    Comment:        Interpretation: PCT (Procalcitonin) <= 0.5 ng/mL: Systemic infection (sepsis) is not likely. Local bacterial infection is possible. (NOTE)       Sepsis PCT Algorithm           Lower Respiratory Tract                                      Infection PCT Algorithm    ----------------------------     ----------------------------         PCT < 0.25 ng/mL                PCT < 0.10 ng/mL          Strongly encourage             Strongly discourage   discontinuation of antibiotics    initiation of antibiotics    ----------------------------     -----------------------------       PCT 0.25 - 0.50 ng/mL            PCT 0.10 - 0.25 ng/mL               OR       >80% decrease in PCT            Discourage initiation of                                            antibiotics      Encourage discontinuation           of antibiotics    ----------------------------      -----------------------------         PCT >= 0.50 ng/mL              PCT 0.26 - 0.50 ng/mL  AND        <80% decrease in PCT             Encourage initiation of                                             antibiotics       Encourage continuation           of antibiotics    ----------------------------     -----------------------------        PCT >= 0.50 ng/mL                  PCT > 0.50 ng/mL               AND         increase in PCT                  Strongly encourage                                      initiation of antibiotics    Strongly encourage escalation           of antibiotics                                     -----------------------------                                           PCT <= 0.25 ng/mL                                                 OR                                        > 80% decrease in PCT                                      Discontinue / Do not initiate                                             antibiotics  Performed at [facility], [place] [address], [place], [place] [address]     MR THORACIC SPINE WO CONTRAST  Result Date: 11/04/2020 CLINICAL DATA:  Back pain and possible cord compression EXAM: MRI THORACIC AND LUMBAR SPINE WITHOUT CONTRAST TECHNIQUE: Multiplanar and multiecho pulse sequences of the thoracic and lumbar spine were obtained without intravenous contrast. COMPARISON:  None. FINDINGS: MRI THORACIC SPINE FINDINGS Alignment:  Physiologic. Vertebrae: No fracture, evidence of discitis, or bone lesion. Cord:  Normal signal and morphology. Paraspinal and other soft tissues: Negative. Disc levels: T5-6: Small left subarticular disc protrusion  without stenosis. The other levels are unremarkable. MRI LUMBAR SPINE FINDINGS Segmentation:  Standard. Alignment:  Physiologic. Vertebrae: Hyperintense T2-weighted signal and low T1-weighted signal within the endplates at the X33443 level. There is narrowing of disc space with mild  hyperintense T2-weighted signal in the disc. No acute fracture. Conus medullaris and cauda equina: Conus extends to the L1-2 level. Conus and cauda equina appear normal. Paraspinal and other soft tissues: Negative. Disc levels: L1-L2: Disc space narrowing with endplate changes as above. No spinal canal stenosis. No neural foraminal stenosis. L2-L3: Normal disc space and facet joints. No spinal canal stenosis. No neural foraminal stenosis. L3-L4: Normal disc space and facet joints. No spinal canal stenosis. No neural foraminal stenosis. L4-L5: Normal disc space and facet joints. No spinal canal stenosis. No neural foraminal stenosis. L5-S1: Normal disc space and facet joints. No spinal canal stenosis. No neural foraminal stenosis. Visualized sacrum: Normal. IMPRESSION: 1. Marked endplate changes at L1-L2 may be due to degenerative disc disease, though diskitis-osteomyelitis is also a possibility. No paraspinal fluid collection or other secondary finding of infection. 2. No spinal cord compression or spinal canal stenosis. Electronically Signed   By: Ulyses Jarred M.D.   On: 11/04/2020 22:50   MR LUMBAR SPINE WO CONTRAST  Result Date: 11/04/2020 CLINICAL DATA:  Back pain and possible cord compression EXAM: MRI THORACIC AND LUMBAR SPINE WITHOUT CONTRAST TECHNIQUE: Multiplanar and multiecho pulse sequences of the thoracic and lumbar spine were obtained without intravenous contrast. COMPARISON:  None. FINDINGS: MRI THORACIC SPINE FINDINGS Alignment:  Physiologic. Vertebrae: No fracture, evidence of discitis, or bone lesion. Cord:  Normal signal and morphology. Paraspinal and other soft tissues: Negative. Disc levels: T5-6: Small left subarticular disc protrusion without stenosis. The other levels are unremarkable. MRI LUMBAR SPINE FINDINGS Segmentation:  Standard. Alignment:  Physiologic. Vertebrae: Hyperintense T2-weighted signal and low T1-weighted signal within the endplates at the X33443 level. There is narrowing of  disc space with mild hyperintense T2-weighted signal in the disc. No acute fracture. Conus medullaris and cauda equina: Conus extends to the L1-2 level. Conus and cauda equina appear normal. Paraspinal and other soft tissues: Negative. Disc levels: L1-L2: Disc space narrowing with endplate changes as above. No spinal canal stenosis. No neural foraminal stenosis. L2-L3: Normal disc space and facet joints. No spinal canal stenosis. No neural foraminal stenosis. L3-L4: Normal disc space and facet joints. No spinal canal stenosis. No neural foraminal stenosis. L4-L5: Normal disc space and facet joints. No spinal canal stenosis. No neural foraminal stenosis. L5-S1: Normal disc space and facet joints. No spinal canal stenosis. No neural foraminal stenosis. Visualized sacrum: Normal. IMPRESSION: 1. Marked endplate changes at L1-L2 may be due to degenerative disc disease, though diskitis-osteomyelitis is also a possibility. No paraspinal fluid collection or other secondary finding of infection. 2. No spinal cord compression or spinal canal stenosis. Electronically Signed   By: Ulyses Jarred M.D.   On: 11/04/2020 22:50   CT CHEST ABDOMEN PELVIS W CONTRAST  Result Date: 11/05/2020 CLINICAL DATA:  60 year old male with 25 pounds of unintended weight loss over the past 2-3 months. EXAM: CT CHEST, ABDOMEN, AND PELVIS WITH CONTRAST TECHNIQUE: Multidetector CT imaging of the chest, abdomen and pelvis was performed following the standard protocol during bolus administration of intravenous contrast. CONTRAST:  15m OMNIPAQUE IOHEXOL 300 MG/ML  SOLN COMPARISON:  No priors. FINDINGS: CT CHEST FINDINGS Cardiovascular: Heart size is normal. There is no significant pericardial fluid, thickening or pericardial calcification. No atherosclerotic calcifications in the thoracic aorta or  the coronary arteries. Mediastinum/Nodes: No pathologically enlarged mediastinal or hilar lymph nodes. Esophagus is unremarkable in appearance. No  axillary lymphadenopathy. Lungs/Pleura: Small calcified granuloma in the right lower lobe. No other suspicious appearing pulmonary nodules or masses are noted. No acute consolidative airspace disease. No pleural effusions. Mild pleuroparenchymal thickening and architectural distortion in the lung apices bilaterally, most compatible with areas of chronic post infectious or inflammatory scarring. Mild scarring also noted in the basal segments of the left lower lobe. Musculoskeletal: There are no aggressive appearing lytic or blastic lesions noted in the visualized portions of the skeleton. CT ABDOMEN PELVIS FINDINGS Hepatobiliary: There are 2 subcentimeter liver lesions in the right lobe of the liver in segment 5 (axial image 79 of series 4) and in segment 6 (axial image 82 of series 4) which are too small to characterize. No intra or extrahepatic biliary ductal dilatation. Gallbladder is normal in appearance. Pancreas: No pancreatic mass. No pancreatic ductal dilatation. No pancreatic or peripancreatic fluid collections or inflammatory changes. Spleen: Unremarkable. Adrenals/Urinary Tract: 1.4 cm low-attenuation lesion in the upper pole of the left kidney compatible with a simple cyst. Right kidney and bilateral adrenal glands are normal in appearance. No hydroureteronephrosis. Urinary bladder is normal in appearance. Stomach/Bowel: The appearance of the stomach is normal. There is no pathologic dilatation of small bowel or colon. Normal appendix. Vascular/Lymphatic: No significant atherosclerotic disease, aneurysm or dissection noted in the abdominal or pelvic vasculature. No lymphadenopathy noted in the abdomen or pelvis. Reproductive: Prostate gland and seminal vesicles are unremarkable in appearance. Bilateral vasectomy clips are noted. Other: Small volume of ascites adjacent to the rectum. No pneumoperitoneum. Musculoskeletal: Advanced loss of disc height at L1-L2 with extensive areas of sclerosis and lucency in  the adjacent vertebral body endplates. IMPRESSION: 1. No definite findings in the chest, abdomen or pelvis to account for the patient's history of weight loss. 2. Small volume of ascites is of uncertain etiology and significance. 3. Findings at L1-L2 may be related to advanced degenerative disc disease, although the possibility of discitis and osteomyelitis is not excluded. See report for MRI of the thoracolumbar spine dated 11/04/2020 for full description. 4. Additional incidental findings, as above. Electronically Signed   By: Vinnie Langton M.D.   On: 11/05/2020 09:29    ROS: Per HPI Blood pressure 103/68, pulse 60, temperature 98.1 F (36.7 C), temperature source Oral, resp. rate 17, height '6\' 1"'$  (1.854 m), weight 63.3 kg, SpO2 98 %. Physical Exam: Patient is awake, A/O X 4, conversant, and in good spirits. THey are in NAD and VSS. Speech is fluent and appropriate. Doing well. MAEW with good strength that is symmetric bilaterally. 5/5 BUE/BLE. Sensation to light touch is intact. PERLA, EOMI. CNs grossly intact.      Assessment/Plan: 60 y.o. male with LBP and unintentional weight loss. CT of the chest abdomen and pelvis with no definite findings in the chest, abdomen or pelvis to account for the patient's history of weight loss. MRI thoracic and lumbar spine were reviewed. He has severe degenerative changes at the L1/2 level without any stenosis or nerve compression. I do not feel that he has any discitis-osteomyelitis. He has full strength on confrontational testing and a negative seated SLR bilaterally. He did have a significant amount of muscle stiffness on exam, especially in his hamstrings. No acute neurosurgical intervention is needed. I have recommended the patient follow up as an outpatient. Continue PT. Continue working on pain control with use of analgesics and muscle relaxers.  Dean Moeller, DNP, NP-C 11/05/2020, 10:56 AM   Addendum:  Patient seen and examined.  Agree with  above.  Severe degenerative changes at L1 to without any significant stenosis centrally, foraminally or in the lateral recess.  CT was also reviewed, again severe degenerative changes.  I think this is less likely to be infectious in etiology however could be an older osteodiscitis at this run its course as his infectious work-up has been negative.  Recommend continue PT as an outpatient.  His pain is improving with physical therapy, antispasmodics.  He will follow-up with me as an outpatient.  Thank you for allowing me to participate in this patient's care.  Please do not hesitate to call with questions or concerns.   Dean Marquez, [facility] Cell: [phone]

## 2020-11-05 NOTE — H&P (View-Only) (Signed)
Consultation  Referring Provider: Dr. Candiss Norse Primary Care Physician:  Patient, No Pcp Per (Inactive) Primary Gastroenterologist: Dr. Hilarie Fredrickson       Reason for Consultation: Weight loss, Change in bowel habits, Early Satiety             HPI:    Dean Marquez is a 60 y.o. male with no significant past medical history except some mid low back pain which has been ongoing for 8 to 10 weeks radiating to the right lower extremity, who presented to the hospital on 11/04/2020 with generalized weakness, unintentional weight loss over 25 to 30 pounds, some lightheadedness and falls.    Patient previously described that he had mid to low back pain around the past 8 to 10 weeks which started while working in the yard, patient has gradually gotten worse along with multiple bouts of severe muscle spasms in his back, he had seen outpatient physiotherapy and supportive care which has helped drastically per his words.  Currently the pain radiates down to the right thigh up to the knee.  Along with this reports he has had an unintentional weight loss of about 25 to 30 pounds over the past few months and has become lightheaded and had a few falls while at work.    Today, the patient discusses that he has been dealing with some back pain and has been going to physical therapy for this has been improving drastically but still has to step carefully and change positions slowly, but this is a great improvement over the past 8 to 10 weeks from where he started to "not even being able to roll over in bed".    Describes that he has always had some IBS for the past 20 years with occasional urgent loose stools and other times going 2 days in between a bowel movement, but about 4 to 5 months ago noted that he had some greasy looking stool and so he completely stopped eating, sugar, meats and gluten.  His stools gradually returned to normal.  Has also been taking some herbal supplement recently and tells me that actually for the  first time in his life he has had more regular bowel movements describing 2 solid "satisfying" stools a day over the past month or so.  Tells me that he also started back on a more "regular diet" over the past 3 weeks in an attempt to put on some weight as he initially had dropped from 163 down to 140, but can only seem to get up to 143 and then will fall back down again.  When asked about this "regular diet", he tells me he is still not eating fats, sweets, gluten but does occasionally have some meat now.  Associated symptoms include some early satiety    Denies fever, chills, headache, chest pain, abdominal pain, cough, shortness of breath, blood in his stool or urine or focal weakness.  Hospital course: 11/04/2020 MRI of the spine with marked endplate changes at [id number]-GRM which may be due to degenerative disc disease, though discitis-osteomyelitis is also a possibility, no paraspinal fluid collection or other secondary chronic infection, no spinal cord compression or spinal canal stenosis 11/05/2020 CT of the chest abdomen and pelvis with no definite findings in the chest, abdomen or pelvis to account for the patient's history of weight loss, small volume of ascites of uncertain etiology and significance, findings at L1-L2 may be related to advanced degenerative disc disease, also 2 subcentimeter liver lesions in the right  lobe of the liver which are too small to characterize  GI history: 07/31/2019 colonoscopy Dr. Hilarie Fredrickson: Normal, repeat recommended in 5 years given family history of colon cancer in his father 04/27/2012 colonoscopy Dr. Hilarie Fredrickson: 2 sessile polyps in the cecum and otherwise normal; pathology showed adenomatous  Past Medical History:  Diagnosis Date  . Finger fracture, right    5th proximal phalanx  . Kidney stones 2000    Past Surgical History:  Procedure Laterality Date  . COLONOSCOPY  2014  . KNEE ARTHROSCOPY Right   . KNEE ARTHROTOMY  2000   right  . OPEN REDUCTION INTERNAL FIXATION  (ORIF) PROXIMAL PHALANX Right 04/17/2017   Procedure: OPEN REDUCTION INTERNAL FIXATION (ORIF) PROXIMAL PHALANX;  Surgeon: Milly Jakob, MD;  Location: [facility];  Service: Orthopedics;  Laterality: Right;    Family History  Problem Relation Age of Onset  . Colon cancer Father 29  . Stomach cancer Neg Hx   . Colon polyps Neg Hx   . Esophageal cancer Neg Hx   . Rectal cancer Neg Hx     Social History   Tobacco Use  . Smoking status: Never  . Smokeless tobacco: Never  Vaping Use  . Vaping Use: Never used  Substance Use Topics  . Alcohol use: Yes    Alcohol/week: 1.0 standard drink    Types: 1 Cans of beer per week    Comment: social  . Drug use: No    Prior to Admission medications   Medication Sig Start Date End Date Taking? Authorizing Provider  ascorbic acid (VITAMIN C) 1000 MG tablet Take 500 mg by mouth daily.   Yes [provider]  b complex vitamins tablet Take 1 tablet by mouth daily.   Yes [provider]  Barberry-Oreg Grape-Goldenseal (BERBERINE COMPLEX PO) Take 1 tablet by mouth 3 (three) times daily.   Yes [provider]  Cholecalciferol 125 MCG (5000 UT) TABS Take 1 tablet by mouth daily.   Yes [provider]  Coenzyme Q10 (COQ10 PO) Take 1 tablet by mouth daily.   Yes [provider]  MAGNESIUM PO Take 1 tablet by mouth daily.   Yes [provider]  Misc Natural Products (ARTEMISIA PO) Take 1 tablet by mouth daily.   Yes [provider]  Multiple Vitamins tablet Take 1 tablet by mouth daily.   Yes [provider]    Current Facility-Administered Medications  Medication Dose Route Frequency Provider Last Rate Last Admin  . acetaminophen (TYLENOL) tablet 650 mg  650 mg Oral Q6H PRN Thurnell Lose, MD      . bisacodyl (DULCOLAX) EC tablet 5 mg  5 mg Oral Daily PRN Thurnell Lose, MD      . cyanocobalamin ((VITAMIN B-12)) injection 1,000 mcg  1,000 mcg Subcutaneous  Daily Thurnell Lose, MD   1,000 mcg at 11/04/20 2101  . docusate sodium (COLACE) capsule 100 mg  100 mg Oral BID Thurnell Lose, MD   100 mg at 11/05/20 0947  . heparin injection 5,000 Units  5,000 Units Subcutaneous Q8H Thurnell Lose, MD   5,000 Units at 11/05/20 0509  . lactated ringers infusion   Intravenous Continuous Thurnell Lose, MD 125 mL/hr at 11/05/20 0353 New Bag at 11/05/20 0353  . methocarbamol (ROBAXIN) 500 mg in dextrose 5 % 50 mL IVPB  500 mg Intravenous Q8H PRN Thurnell Lose, MD      . morphine 2 MG/ML injection 0.5 mg  0.5  mg Intravenous Q4H PRN Thurnell Lose, MD   0.5 mg at 11/05/20 0953  . ondansetron (ZOFRAN) injection 4 mg  4 mg Intravenous Q6H PRN Thurnell Lose, MD      . polyethylene glycol (MIRALAX / GLYCOLAX) packet 17 g  17 g Oral BID Thurnell Lose, MD      . traMADol Veatrice Bourbon) tablet 50 mg  50 mg Oral Q6H PRN Thurnell Lose, MD   50 mg at 11/04/20 1844    Allergies as of 11/04/2020  . (No Known Allergies)     Review of Systems:    Constitutional: No fever or chills Skin: No rash  Cardiovascular: No chest pain Respiratory: No SOB Gastrointestinal: See HPI and otherwise negative Genitourinary: No dysuria Neurological: +syncope Musculoskeletal: +back pain Hematologic: No bleeding  Psychiatric: No history of depression or anxiety    Physical Exam:  Vital signs in last 24 hours: Temp:  [97.9 F (36.6 C)-98.1 F (36.7 C)] 98.1 F (36.7 C) (08/04 0841) Pulse Rate:  [60-71] 60 (08/04 0841) Resp:  [15-18] 17 (08/04 0841) BP: (96-109)/(61-72) 103/68 (08/04 0841) SpO2:  [93 %-98 %] 98 % (08/04 0841) Weight:  [63.3 kg] 63.3 kg (08/03 1700) Last BM Date: 11/03/20 General:   Pleasant Caucasian male appears to be in NAD, Well developed, Well nourished, alert and cooperative Head:  Normocephalic and atraumatic. Eyes:   PEERL, EOMI. No icterus. Conjunctiva pink. Ears:  Normal auditory acuity. Neck:  Supple Throat: Oral cavity  and pharynx without inflammation, swelling or lesion.  Lungs: Respirations even and unlabored. Lungs clear to auscultation bilaterally.   No wheezes, crackles, or rhonchi.  Heart: Normal S1, S2. No MRG. Regular rate and rhythm. No peripheral edema, cyanosis or pallor.  Abdomen:  Soft, nondistended, nontender. No rebound or guarding. Normal bowel sounds. No appreciable masses or hepatomegaly. Rectal:  Not performed.  Msk:  Symmetrical without gross deformities. Peripheral pulses intact.  Extremities:  Without edema, no deformity or joint abnormality. +weakness b/l legs, unsteady, slowed gait Neurologic:  Alert and  oriented x4;  grossly normal neurologically.  Skin:   Dry and intact without significant lesions or rashes. Psychiatric: Demonstrates good judgement and reason without abnormal affect or behaviors.   LAB RESULTS: Recent Labs    11/04/20 1814  WBC 7.0  HGB 13.8  HCT 39.2  PLT 241   BMET Recent Labs    11/04/20 1814 11/05/20 0550  NA 134* 137  K 3.9 4.2  CL 98 101  CO2 26 29  GLUCOSE 93 91  BUN 19 15  CREATININE 0.88 0.83  CALCIUM 9.1 9.2   LFT Recent Labs    11/04/20 1814  PROT 6.4*  ALBUMIN 3.6  AST 13*  ALT 11  ALKPHOS 95  BILITOT 0.6   PT/INR Recent Labs    11/05/20 0550  LABPROT 13.3  INR 1.0    STUDIES: MR THORACIC SPINE WO CONTRAST  Result Date: 11/04/2020 CLINICAL DATA:  Back pain and possible cord compression EXAM: MRI THORACIC AND LUMBAR SPINE WITHOUT CONTRAST TECHNIQUE: Multiplanar and multiecho pulse sequences of the thoracic and lumbar spine were obtained without intravenous contrast. COMPARISON:  None. FINDINGS: MRI THORACIC SPINE FINDINGS Alignment:  Physiologic. Vertebrae: No fracture, evidence of discitis, or bone lesion. Cord:  Normal signal and morphology. Paraspinal and other soft tissues: Negative. Disc levels: T5-6: Small left subarticular disc protrusion without stenosis. The other levels are unremarkable. MRI LUMBAR SPINE  FINDINGS Segmentation:  Standard. Alignment:  Physiologic. Vertebrae: Hyperintense T2-weighted signal  and low T1-weighted signal within the endplates at the X33443 level. There is narrowing of disc space with mild hyperintense T2-weighted signal in the disc. No acute fracture. Conus medullaris and cauda equina: Conus extends to the L1-2 level. Conus and cauda equina appear normal. Paraspinal and other soft tissues: Negative. Disc levels: L1-L2: Disc space narrowing with endplate changes as above. No spinal canal stenosis. No neural foraminal stenosis. L2-L3: Normal disc space and facet joints. No spinal canal stenosis. No neural foraminal stenosis. L3-L4: Normal disc space and facet joints. No spinal canal stenosis. No neural foraminal stenosis. L4-L5: Normal disc space and facet joints. No spinal canal stenosis. No neural foraminal stenosis. L5-S1: Normal disc space and facet joints. No spinal canal stenosis. No neural foraminal stenosis. Visualized sacrum: Normal. IMPRESSION: 1. Marked endplate changes at L1-L2 may be due to degenerative disc disease, though diskitis-osteomyelitis is also a possibility. No paraspinal fluid collection or other secondary finding of infection. 2. No spinal cord compression or spinal canal stenosis. Electronically Signed   By: Ulyses Jarred M.D.   On: 11/04/2020 22:50   MR LUMBAR SPINE WO CONTRAST  Result Date: 11/04/2020 CLINICAL DATA:  Back pain and possible cord compression EXAM: MRI THORACIC AND LUMBAR SPINE WITHOUT CONTRAST TECHNIQUE: Multiplanar and multiecho pulse sequences of the thoracic and lumbar spine were obtained without intravenous contrast. COMPARISON:  None. FINDINGS: MRI THORACIC SPINE FINDINGS Alignment:  Physiologic. Vertebrae: No fracture, evidence of discitis, or bone lesion. Cord:  Normal signal and morphology. Paraspinal and other soft tissues: Negative. Disc levels: T5-6: Small left subarticular disc protrusion without stenosis. The other levels are  unremarkable. MRI LUMBAR SPINE FINDINGS Segmentation:  Standard. Alignment:  Physiologic. Vertebrae: Hyperintense T2-weighted signal and low T1-weighted signal within the endplates at the X33443 level. There is narrowing of disc space with mild hyperintense T2-weighted signal in the disc. No acute fracture. Conus medullaris and cauda equina: Conus extends to the L1-2 level. Conus and cauda equina appear normal. Paraspinal and other soft tissues: Negative. Disc levels: L1-L2: Disc space narrowing with endplate changes as above. No spinal canal stenosis. No neural foraminal stenosis. L2-L3: Normal disc space and facet joints. No spinal canal stenosis. No neural foraminal stenosis. L3-L4: Normal disc space and facet joints. No spinal canal stenosis. No neural foraminal stenosis. L4-L5: Normal disc space and facet joints. No spinal canal stenosis. No neural foraminal stenosis. L5-S1: Normal disc space and facet joints. No spinal canal stenosis. No neural foraminal stenosis. Visualized sacrum: Normal. IMPRESSION: 1. Marked endplate changes at L1-L2 may be due to degenerative disc disease, though diskitis-osteomyelitis is also a possibility. No paraspinal fluid collection or other secondary finding of infection. 2. No spinal cord compression or spinal canal stenosis. Electronically Signed   By: Ulyses Jarred M.D.   On: 11/04/2020 22:50   CT CHEST ABDOMEN PELVIS W CONTRAST  Result Date: 11/05/2020 CLINICAL DATA:  60 year old male with 25 pounds of unintended weight loss over the past 2-3 months. EXAM: CT CHEST, ABDOMEN, AND PELVIS WITH CONTRAST TECHNIQUE: Multidetector CT imaging of the chest, abdomen and pelvis was performed following the standard protocol during bolus administration of intravenous contrast. CONTRAST:  164m OMNIPAQUE IOHEXOL 300 MG/ML  SOLN COMPARISON:  No priors. FINDINGS: CT CHEST FINDINGS Cardiovascular: Heart size is normal. There is no significant pericardial fluid, thickening or pericardial  calcification. No atherosclerotic calcifications in the thoracic aorta or the coronary arteries. Mediastinum/Nodes: No pathologically enlarged mediastinal or hilar lymph nodes. Esophagus is unremarkable in appearance. No axillary lymphadenopathy. Lungs/Pleura:  Small calcified granuloma in the right lower lobe. No other suspicious appearing pulmonary nodules or masses are noted. No acute consolidative airspace disease. No pleural effusions. Mild pleuroparenchymal thickening and architectural distortion in the lung apices bilaterally, most compatible with areas of chronic post infectious or inflammatory scarring. Mild scarring also noted in the basal segments of the left lower lobe. Musculoskeletal: There are no aggressive appearing lytic or blastic lesions noted in the visualized portions of the skeleton. CT ABDOMEN PELVIS FINDINGS Hepatobiliary: There are 2 subcentimeter liver lesions in the right lobe of the liver in segment 5 (axial image 79 of series 4) and in segment 6 (axial image 82 of series 4) which are too small to characterize. No intra or extrahepatic biliary ductal dilatation. Gallbladder is normal in appearance. Pancreas: No pancreatic mass. No pancreatic ductal dilatation. No pancreatic or peripancreatic fluid collections or inflammatory changes. Spleen: Unremarkable. Adrenals/Urinary Tract: 1.4 cm low-attenuation lesion in the upper pole of the left kidney compatible with a simple cyst. Right kidney and bilateral adrenal glands are normal in appearance. No hydroureteronephrosis. Urinary bladder is normal in appearance. Stomach/Bowel: The appearance of the stomach is normal. There is no pathologic dilatation of small bowel or colon. Normal appendix. Vascular/Lymphatic: No significant atherosclerotic disease, aneurysm or dissection noted in the abdominal or pelvic vasculature. No lymphadenopathy noted in the abdomen or pelvis. Reproductive: Prostate gland and seminal vesicles are unremarkable in  appearance. Bilateral vasectomy clips are noted. Other: Small volume of ascites adjacent to the rectum. No pneumoperitoneum. Musculoskeletal: Advanced loss of disc height at L1-L2 with extensive areas of sclerosis and lucency in the adjacent vertebral body endplates. IMPRESSION: 1. No definite findings in the chest, abdomen or pelvis to account for the patient's history of weight loss. 2. Small volume of ascites is of uncertain etiology and significance. 3. Findings at L1-L2 may be related to advanced degenerative disc disease, although the possibility of discitis and osteomyelitis is not excluded. See report for MRI of the thoracolumbar spine dated 11/04/2020 for full description. 4. Additional incidental findings, as above. Electronically Signed   By: Vinnie Langton M.D.   On: 11/05/2020 09:29      Impression / Plan:   Impression: 1.  Unintentional weight loss: About 25 to 30 pounds over the past months, seemingly unable to gain weight; consider underlying malignancy versus drastic diet change worse other 2.  Low back and leg pain: Started after patient was doing an intense gardening project, now "drastic improvement" per him over the past 8 weeks with physical therapy 3.  Early satiety 4.  Change in bowel habits 5.  Greasy stools: About 4 to 5 months ago, patient drastically changed his diet and stopped eating sugars, fats, sweets or gluten, stools are now back to normal with 2 solid stools a day; consider element of pancreatic insufficiency  Plan: 1.  Discussed with patient that likely a large amount of his weight loss is due to drastic change in diet with no further sugar, fats, meats or gluten over the past 4 to 5 months. 2.  We will plan for EGD and colonoscopy tomorrow with Dr. Hilarie Fredrickson.  Did discuss risks, benefits,  limitations and alternatives and patient agrees to proceed. 3.  Patient will be on a clear liquid diet today and n.p.o. at midnight 4.  We will start movi prep this afternoon in  split dose fashion 5.  Added GI pathogen panel and O&P and will add pancreatic elastase  Thank you for your kind consultation, we  will continue to follow.  Dean Marquez  11/05/2020, 10:18 AM

## 2020-11-06 ENCOUNTER — Inpatient Hospital Stay (HOSPITAL_COMMUNITY): Payer: 59 | Admitting: Anesthesiology

## 2020-11-06 ENCOUNTER — Encounter (HOSPITAL_COMMUNITY)
Admission: AD | Disposition: A | Discharge status: Home / Self Care | Payer: Self-pay | Source: Ambulatory Visit | Attending: Internal Medicine

## 2020-11-06 ENCOUNTER — Other Ambulatory Visit (HOSPITAL_COMMUNITY): Payer: Self-pay

## 2020-11-06 ENCOUNTER — Encounter (HOSPITAL_COMMUNITY): Payer: Self-pay | Admitting: Internal Medicine

## 2020-11-06 DIAGNOSIS — K222 Esophageal obstruction: Secondary | ICD-10-CM

## 2020-11-06 DIAGNOSIS — R143 Flatulence: Secondary | ICD-10-CM

## 2020-11-06 DIAGNOSIS — R194 Change in bowel habit: Secondary | ICD-10-CM

## 2020-11-06 DIAGNOSIS — D122 Benign neoplasm of ascending colon: Secondary | ICD-10-CM

## 2020-11-06 HISTORY — PX: POLYPECTOMY: SHX5525

## 2020-11-06 HISTORY — PX: BIOPSY: SHX5522

## 2020-11-06 HISTORY — PX: ESOPHAGOGASTRODUODENOSCOPY (EGD) WITH PROPOFOL: SHX5813

## 2020-11-06 HISTORY — PX: COLONOSCOPY WITH PROPOFOL: SHX5780

## 2020-11-06 LAB — BASIC METABOLIC PANEL
Anion gap: 8 (ref 5–15)
BUN: 10 mg/dL (ref 6–20)
CO2: 28 mmol/L (ref 22–32)
Calcium: 9.3 mg/dL (ref 8.9–10.3)
Chloride: 103 mmol/L (ref 98–111)
Creatinine, Ser: 0.83 mg/dL (ref 0.61–1.24)
GFR, Estimated: >60 mL/min (ref >60)
Glucose, Bld: 82 mg/dL (ref 70–99)
Potassium: 3.7 mmol/L (ref 3.5–5.1)
Sodium: 139 mmol/L (ref 135–145)

## 2020-11-06 LAB — GASTROINTESTINAL PANEL BY PCR, STOOL (REPLACES STOOL CULTURE)

## 2020-11-06 LAB — MAGNESIUM: Magnesium: 1.9 mg/dL (ref 1.7–2.4)

## 2020-11-06 SURGERY — ESOPHAGOGASTRODUODENOSCOPY (EGD) WITH PROPOFOL
Anesthesia: Monitor Anesthesia Care

## 2020-11-06 MED ORDER — BD SYRINGE SLIP TIP 1 ML MISC
0 refills | Status: AC
  Filled 2020-11-06: qty 10, fill #0

## 2020-11-06 MED ORDER — IBUPROFEN 400 MG PO TABS: 400.0000 mg | ORAL_TABLET | Freq: Three times a day (TID) | ORAL | 0 refills | Status: AC | PRN

## 2020-11-06 MED ORDER — LIDOCAINE HCL (CARDIAC) PF 100 MG/5ML IV SOSY
PREFILLED_SYRINGE | INTRAVENOUS | Status: DC | PRN
  Administered 2020-11-06: 50 mg via INTRAVENOUS

## 2020-11-06 MED ORDER — CYANOCOBALAMIN 1000 MCG/ML IJ SOLN
1000.0000 ug | INTRAMUSCULAR | 10 refills | Status: AC
  Filled 2020-11-06: qty 1, 30d supply, fill #0

## 2020-11-06 MED ORDER — BACLOFEN 10 MG PO TABS
5.0000 mg | ORAL_TABLET | Freq: Three times a day (TID) | ORAL | 0 refills | Status: AC | PRN
  Filled 2020-11-06: qty 25, 17d supply, fill #0

## 2020-11-06 MED ORDER — EPHEDRINE SULFATE-NACL 50-0.9 MG/10ML-% IV SOSY
PREFILLED_SYRINGE | INTRAVENOUS | Status: DC | PRN
  Administered 2020-11-06 (×5): 5 mg via INTRAVENOUS

## 2020-11-06 MED ORDER — LACTATED RINGERS IV SOLN: INTRAVENOUS | Status: DC

## 2020-11-06 MED ORDER — PROPOFOL 500 MG/50ML IV EMUL
INTRAVENOUS | Status: DC | PRN
  Administered 2020-11-06: 130 ug/kg/min via INTRAVENOUS
  Administered 2020-11-06: 120 ug/kg/min via INTRAVENOUS

## 2020-11-06 MED ORDER — PROPOFOL 10 MG/ML IV BOLUS
INTRAVENOUS | Status: DC | PRN
  Administered 2020-11-06 (×2): 20 mg via INTRAVENOUS

## 2020-11-06 MED ORDER — METRONIDAZOLE 500 MG PO TABS
500.0000 mg | ORAL_TABLET | Freq: Three times a day (TID) | ORAL | 0 refills | Status: AC
  Filled 2020-11-06: qty 30, 10d supply, fill #0

## 2020-11-06 SURGICAL SUPPLY — 25 items

## 2020-11-06 NOTE — Discharge Summary (Signed)
Dean Marquez [id number] DOB: [birth year] DOA: 11/04/2020  PCP: Patient, No Pcp Per (Inactive)  Admit date: 11/04/2020  Discharge date: 11/06/2020  Admitted From: Home   Disposition:  Home   Recommendations for Outpatient Follow-up:   Follow up with PCP in 1-2 weeks  PCP Please obtain BMP/CBC, 2 view CXR in 1week,  (see Discharge instructions)   PCP Please follow up on the following pending results: Pending [place]: None   Equipment/Devices: as below  Consultations: N.Surg, GI Discharge Condition: Stable    CODE STATUS: Full    Diet Recommendation: Regular  CC - Back pain, weight loss  Brief history of present illness from the day of admission and additional interim summary    Dean Marquez  is a 60 y.o. male, with no significant past medical history except mid low back pain which has been now ongoing for 8 to 10 weeks radiating to the right lower extremity, generalized weakness and unintentional weight loss over 25 to 30 pounds, few falls at work with lightheadedness.   According to the patient he had developed mid to low back pain around 8 to 10 weeks ago while working in the yard, pain has gradually gotten worse along with multiple bouts of severe muscle spasms in the back, outpatient physiotherapy and supportive care have not helped much, at time pain radiates down the right thigh up to the knee, he also reports that he has had unintentional weight loss of about 25 to 30 pounds over the last few months, also at work he has become lightheaded and had a few falls.                                                                 Hospital Course       1.  Ongoing back pain with unintentional 25 to 30 pound weight loss, right lower extremity mild weakness on exam - so far imaging suggests severe  L1-L2 endplate degeneration which could be the cause of his severe back pain and limited mobility.  Continue pain control with NSAIDs outpatient, muscle relaxants.  Seen by PT OT and neurosurgery, no signs of infection, CRP stable no fever or leukocytosis, blood cultures negative thus far, continue with supportive care with outpatient neurosurgery follow-up, requested to wear lumbar support brace/belt when out of bed.   2.  Unintentional 25 to 30 pound weight loss.  Poor appetite, also some history suggestive of early satiety, or when EGD and colonoscopy both were nonacute except for nonobstructing Schatzki ring, outpatient GI follow-up.  Likely weight loss due to malabsorption, continue work-up outpatient with GI.  Liberal diet.  Note quanteferon test ordered and results pending request PCP to follow.   3.  Low B12.  Question malabsorption, placed on replacement subcu.   4.  Minimal nonspecific ascites.  Likely due to third spacing from low protein intake, encouraged on protein intake, GI to evaluate as well.  Patient follow-up with GI.   5.  Few unexplained falls.  Likely due to hypotension, back pain and muscle spasms.  Hydrate with IV fluids, EKG labile, stable on telemetry, able echo, aggressively hydrated with IV fluids, PT OT, balance much improved, continue supportive care with muscle relaxers and NSAIDs, fall precautions post discharge.    6.  Severe malnutrition.  BMI of 18.  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  Previous history of Dientamoeba fragilis, will give 10-day trial of oral Flagyl, outpatient GI follow-up.  EGD colonoscopy as above.  1     Discharge diagnosis     Principal Problem:   Syncope Active Problems:   Weight loss, unintentional   Back pain associated with peripheral numbness   Dientameba fragilis infection   Abdominal gas pain   Change in bowel habits   Benign neoplasm of ascending colon    Discharge instructions     Discharge Instructions     Diet - low sodium heart healthy   Complete by: As directed    Discharge instructions   Complete by: As directed    Follow with Primary MD Patient, No Pcp Per (Inactive) in 7 days, Pending Quantiferon levels  Get CBC, CMP, b12 -  checked next visit within 4 weeks by Primary MD    Activity: As tolerated with Full fall precautions use walker/cane & assistance as needed  Disposition Home    Diet: Regular  Special Instructions: If you have smoked or chewed Tobacco  in the last 2 yrs please stop smoking, stop any regular Alcohol  and or any Recreational drug use.  On your next visit with your primary care physician please Get Medicines reviewed and adjusted.  Please request your Prim.MD to go over all Hospital Tests and Procedure/Radiological results at the follow up, please get all Hospital records sent to your Prim MD by signing hospital release before you go home.  If you experience worsening of your admission symptoms, develop shortness of breath, life threatening emergency, suicidal or homicidal thoughts you must seek medical attention immediately by calling 911 or calling your MD immediately  if symptoms less severe.  You Must read complete instructions/literature along with all the possible adverse reactions/side effects for all the Medicines you take and that have been prescribed to you. Take any new Medicines after you have completely understood and accpet all the possible adverse reactions/side effects.   Increase activity slowly   Complete by: As directed        Discharge Medications   Allergies as of 11/06/2020   No Known Allergies      Medication List     TAKE these medications    ARTEMISIA PO Take 1 tablet by mouth daily.   ascorbic acid 1000 MG tablet Commonly known as: VITAMIN C Take 500 mg by mouth daily.   b complex vitamins tablet Take 1 tablet by mouth daily.   B-D SYRINGE SLIP TIP 1CC 1 ML Misc Generic drug: Syringe  (Disposable) 1 ML BD syringe for SQ B12 shots once a month   baclofen 10 MG tablet Commonly known as: LIORESAL Take 0.5 tablets (5 mg total) by mouth 3 (three) times daily as needed for muscle spasms.   BERBERINE COMPLEX PO Take 1 tablet by mouth 3 (three) times daily.   Cholecalciferol 125 MCG (5000 UT) Tabs Take 1  tablet by mouth daily.   COQ10 PO Take 1 tablet by mouth daily.   cyanocobalamin 1000 MCG/ML injection Commonly known as: (VITAMIN B-12) Inject 1 mL (1,000 mcg total) into the skin every 30 (thirty) days.   ibuprofen 400 MG tablet Commonly known as: ADVIL Take 1 tablet (400 mg total) by mouth every 8 (eight) hours as needed for moderate pain or cramping.   MAGNESIUM PO Take 1 tablet by mouth daily.   metroNIDAZOLE 500 MG tablet Commonly known as: Flagyl Take 1 tablet (500 mg total) by mouth 3 (three) times daily for 10 days.   Multiple Vitamins tablet Take 1 tablet by mouth daily.         Follow-up Information     Dawley, Troy C, DO. Schedule an appointment as soon as possible for a visit in 1 week(s).   Contact information: [address] [place] [address] [phone]         Jerene Bears, MD. Schedule an appointment as soon as possible for a visit in 1 week(s).   Specialty: Gastroenterology Contact information: [address] [place] [address] [phone]         PCP. Schedule an appointment as soon as possible for a visit in 1 week(s).                  Major procedures and Radiology Reports - PLEASE review detailed and final reports thoroughly  -        MR THORACIC SPINE WO CONTRAST  Result Date: 11/04/2020 CLINICAL DATA:  Back pain and possible cord compression EXAM: MRI THORACIC AND LUMBAR SPINE WITHOUT CONTRAST TECHNIQUE: Multiplanar and multiecho pulse sequences of the thoracic and lumbar spine were obtained without intravenous contrast. COMPARISON:  None. FINDINGS: MRI THORACIC SPINE FINDINGS  Alignment:  Physiologic. Vertebrae: No fracture, evidence of discitis, or bone lesion. Cord:  Normal signal and morphology. Paraspinal and other soft tissues: Negative. Disc levels: T5-6: Small left subarticular disc protrusion without stenosis. The other levels are unremarkable. MRI LUMBAR SPINE FINDINGS Segmentation:  Standard. Alignment:  Physiologic. Vertebrae: Hyperintense T2-weighted signal and low T1-weighted signal within the endplates at the X33443 level. There is narrowing of disc space with mild hyperintense T2-weighted signal in the disc. No acute fracture. Conus medullaris and cauda equina: Conus extends to the L1-2 level. Conus and cauda equina appear normal. Paraspinal and other soft tissues: Negative. Disc levels: L1-L2: Disc space narrowing with endplate changes as above. No spinal canal stenosis. No neural foraminal stenosis. L2-L3: Normal disc space and facet joints. No spinal canal stenosis. No neural foraminal stenosis. L3-L4: Normal disc space and facet joints. No spinal canal stenosis. No neural foraminal stenosis. L4-L5: Normal disc space and facet joints. No spinal canal stenosis. No neural foraminal stenosis. L5-S1: Normal disc space and facet joints. No spinal canal stenosis. No neural foraminal stenosis. Visualized sacrum: Normal. IMPRESSION: 1. Marked endplate changes at L1-L2 may be due to degenerative disc disease, though diskitis-osteomyelitis is also a possibility. No paraspinal fluid collection or other secondary finding of infection. 2. No spinal cord compression or spinal canal stenosis. Electronically Signed   By: Ulyses Jarred M.D.   On: 11/04/2020 22:50   MR LUMBAR SPINE WO CONTRAST  Result Date: 11/04/2020 CLINICAL DATA:  Back pain and possible cord compression EXAM: MRI THORACIC AND LUMBAR SPINE WITHOUT CONTRAST TECHNIQUE: Multiplanar and multiecho pulse sequences of the thoracic and lumbar spine were obtained without intravenous contrast. COMPARISON:  None. FINDINGS: MRI  THORACIC SPINE FINDINGS Alignment:  Physiologic. Vertebrae: No fracture, evidence of discitis, or bone lesion. Cord:  Normal signal and morphology. Paraspinal and other soft tissues: Negative. Disc levels: T5-6: Small left subarticular disc protrusion without stenosis. The other levels are unremarkable. MRI LUMBAR SPINE FINDINGS Segmentation:  Standard. Alignment:  Physiologic. Vertebrae: Hyperintense T2-weighted signal and low T1-weighted signal within the endplates at the X33443 level. There is narrowing of disc space with mild hyperintense T2-weighted signal in the disc. No acute fracture. Conus medullaris and cauda equina: Conus extends to the L1-2 level. Conus and cauda equina appear normal. Paraspinal and other soft tissues: Negative. Disc levels: L1-L2: Disc space narrowing with endplate changes as above. No spinal canal stenosis. No neural foraminal stenosis. L2-L3: Normal disc space and facet joints. No spinal canal stenosis. No neural foraminal stenosis. L3-L4: Normal disc space and facet joints. No spinal canal stenosis. No neural foraminal stenosis. L4-L5: Normal disc space and facet joints. No spinal canal stenosis. No neural foraminal stenosis. L5-S1: Normal disc space and facet joints. No spinal canal stenosis. No neural foraminal stenosis. Visualized sacrum: Normal. IMPRESSION: 1. Marked endplate changes at L1-L2 may be due to degenerative disc disease, though diskitis-osteomyelitis is also a possibility. No paraspinal fluid collection or other secondary finding of infection. 2. No spinal cord compression or spinal canal stenosis. Electronically Signed   By: Ulyses Jarred M.D.   On: 11/04/2020 22:50   CT CHEST ABDOMEN PELVIS W CONTRAST  Result Date: 11/05/2020 CLINICAL DATA:  60 year old male with 25 pounds of unintended weight loss over the past 2-3 months. EXAM: CT CHEST, ABDOMEN, AND PELVIS WITH CONTRAST TECHNIQUE: Multidetector CT imaging of the chest, abdomen and pelvis was performed following  the standard protocol during bolus administration of intravenous contrast. CONTRAST:  131m OMNIPAQUE IOHEXOL 300 MG/ML  SOLN COMPARISON:  No priors. FINDINGS: CT CHEST FINDINGS Cardiovascular: Heart size is normal. There is no significant pericardial fluid, thickening or pericardial calcification. No atherosclerotic calcifications in the thoracic aorta or the coronary arteries. Mediastinum/Nodes: No pathologically enlarged mediastinal or hilar lymph nodes. Esophagus is unremarkable in appearance. No axillary lymphadenopathy. Lungs/Pleura: Small calcified granuloma in the right lower lobe. No other suspicious appearing pulmonary nodules or masses are noted. No acute consolidative airspace disease. No pleural effusions. Mild pleuroparenchymal thickening and architectural distortion in the lung apices bilaterally, most compatible with areas of chronic post infectious or inflammatory scarring. Mild scarring also noted in the basal segments of the left lower lobe. Musculoskeletal: There are no aggressive appearing lytic or blastic lesions noted in the visualized portions of the skeleton. CT ABDOMEN PELVIS FINDINGS Hepatobiliary: There are 2 subcentimeter liver lesions in the right lobe of the liver in segment 5 (axial image 79 of series 4) and in segment 6 (axial image 82 of series 4) which are too small to characterize. No intra or extrahepatic biliary ductal dilatation. Gallbladder is normal in appearance. Pancreas: No pancreatic mass. No pancreatic ductal dilatation. No pancreatic or peripancreatic fluid collections or inflammatory changes. Spleen: Unremarkable. Adrenals/Urinary Tract: 1.4 cm low-attenuation lesion in the upper pole of the left kidney compatible with a simple cyst. Right kidney and bilateral adrenal glands are normal in appearance. No hydroureteronephrosis. Urinary bladder is normal in appearance. Stomach/Bowel: The appearance of the stomach is normal. There is no pathologic dilatation of small bowel  or colon. Normal appendix. Vascular/Lymphatic: No significant atherosclerotic disease, aneurysm or dissection noted in the abdominal or pelvic vasculature. No lymphadenopathy noted in the abdomen or pelvis. Reproductive: Prostate gland  and seminal vesicles are unremarkable in appearance. Bilateral vasectomy clips are noted. Other: Small volume of ascites adjacent to the rectum. No pneumoperitoneum. Musculoskeletal: Advanced loss of disc height at L1-L2 with extensive areas of sclerosis and lucency in the adjacent vertebral body endplates. IMPRESSION: 1. No definite findings in the chest, abdomen or pelvis to account for the patient's history of weight loss. 2. Small volume of ascites is of uncertain etiology and significance. 3. Findings at L1-L2 may be related to advanced degenerative disc disease, although the possibility of discitis and osteomyelitis is not excluded. See report for MRI of the thoracolumbar spine dated 11/04/2020 for full description. 4. Additional incidental findings, as above. Electronically Signed   By: Vinnie Langton M.D.   On: 11/05/2020 09:29   ECHOCARDIOGRAM COMPLETE  Result Date: 11/05/2020    ECHOCARDIOGRAM REPORT   Patient Name:   Dean Marquez Date of Exam: 11/05/2020 Medical Rec #:  [id number]      Height:       73.0 in Accession #:    [id number]     Weight:       139.6 lb Date of Birth:  Oct 26, 1960       BSA:          1.847 m Patient Age:    60 years       BP:           103/68 mmHg Patient Gender: M              HR:           54 bpm. Exam Location:  Inpatient Procedure: 2D Echo, Cardiac Doppler and Color Doppler Indications:    CHF-Acute Diastolic [id number] / [id number]  History:        Patient has no prior history of Echocardiogram examinations.                 Risk Factors:Non-Smoker.  Sonographer:    Vickie Epley RDCS Referring Phys: [place]  1. Left ventricular ejection fraction, by estimation, is 55 to 60%. The left ventricle has normal function. The left  ventricle has no regional wall motion abnormalities. Left ventricular diastolic parameters were normal.  2. Right ventricular systolic function is normal. The right ventricular size is normal. There is normal pulmonary artery systolic pressure. The estimated right ventricular systolic pressure is Q000111Q mmHg.  3. The mitral valve is normal in structure. No evidence of mitral valve regurgitation. No evidence of mitral stenosis.  4. The aortic valve is tricuspid. Aortic valve regurgitation is not visualized. No aortic stenosis is present.  5. The inferior vena cava is normal in size with greater than 50% respiratory variability, suggesting right atrial pressure of 3 mmHg.  6. Normal study. FINDINGS  Left Ventricle: Left ventricular ejection fraction, by estimation, is 55 to 60%. The left ventricle has normal function. The left ventricle has no regional wall motion abnormalities. The left ventricular internal cavity size was normal in size. There is  no left ventricular hypertrophy. Left ventricular diastolic parameters were normal. Right Ventricle: The right ventricular size is normal. Right vetricular wall thickness was not well visualized. Right ventricular systolic function is normal. There is normal pulmonary artery systolic pressure. The tricuspid regurgitant velocity is 1.60 m/s, and with an assumed right atrial pressure of 3 mmHg, the estimated right ventricular systolic pressure is Q000111Q mmHg. Left Atrium: Left atrial size was normal in size. Right Atrium: Right atrial size was normal in size. Pericardium: There is no  evidence of pericardial effusion. Mitral Valve: The mitral valve is normal in structure. No evidence of mitral valve regurgitation. No evidence of mitral valve stenosis. Tricuspid Valve: The tricuspid valve is normal in structure. Tricuspid valve regurgitation is trivial. Aortic Valve: The aortic valve is tricuspid. Aortic valve regurgitation is not visualized. No aortic stenosis is present.  Pulmonic Valve: The pulmonic valve was normal in structure. Pulmonic valve regurgitation is not visualized. Aorta: The aortic root is normal in size and structure. Venous: The inferior vena cava is normal in size with greater than 50% respiratory variability, suggesting right atrial pressure of 3 mmHg. IAS/Shunts: No atrial level shunt detected by color flow Doppler.  LEFT VENTRICLE PLAX 2D LVIDd:         5.10 cm      Diastology LVIDs:         3.70 cm      LV e' medial:    10.80 cm/s LV PW:         0.70 cm      LV E/e' medial:  4.8 LV IVS:        0.70 cm      LV e' lateral:   14.50 cm/s LVOT diam:     2.20 cm      LV E/e' lateral: 3.6 LV SV:         77 LV SV Index:   42 LVOT Area:     3.80 cm  LV Volumes (MOD) LV vol d, MOD A2C: 88.3 ml LV vol d, MOD A4C: 122.0 ml LV vol s, MOD A2C: 34.6 ml LV vol s, MOD A4C: 53.8 ml LV SV MOD A2C:     53.7 ml LV SV MOD A4C:     122.0 ml LV SV MOD BP:      63.9 ml RIGHT VENTRICLE RV S prime:     14.90 cm/s TAPSE (M-mode): 3.0 cm LEFT ATRIUM             Index       RIGHT ATRIUM           Index LA diam:        3.20 cm 1.73 cm/m  RA Area:     16.50 cm LA Vol (A2C):   29.8 ml 16.14 ml/m RA Volume:   40.60 ml  21.98 ml/m LA Vol (A4C):   42.9 ml 23.23 ml/m LA Biplane Vol: 37.5 ml 20.31 ml/m  AORTIC VALVE LVOT Vmax:   82.40 cm/s LVOT Vmean:  57.900 cm/s LVOT VTI:    0.203 m  AORTA Ao Root diam: 3.50 cm MITRAL VALVE               TRICUSPID VALVE MV Area (PHT): 2.91 cm    TR Peak grad:   10.2 mmHg MV Decel Time: 261 msec    TR Vmax:        160.00 cm/s MV E velocity: 52.00 cm/s MV A velocity: 32.50 cm/s  SHUNTS MV E/A ratio:  1.60        Systemic VTI:  0.20 m                            Systemic Diam: 2.20 cm Loralie Champagne MD Electronically signed by Loralie Champagne MD Signature Date/Time: 11/05/2020/2:31:55 PM    Final     Micro Results    Recent Results (from the past 240 hour(s))  SARS CORONAVIRUS 2 (TAT 6-24 HRS) Nasopharyngeal Nasopharyngeal Swab  Status: None   Collection  Time: 11/04/20  5:34 PM   Specimen: Nasopharyngeal Swab  Result Value Ref Range Status   SARS Coronavirus 2 NEGATIVE NEGATIVE Final    Comment: (NOTE) SARS-CoV-2 target nucleic acids are NOT DETECTED.  The SARS-CoV-2 RNA is generally detectable in upper and lower respiratory specimens during the acute phase of infection. Negative results do not preclude SARS-CoV-2 infection, do not rule out co-infections with other pathogens, and should not be used as the sole basis for treatment or other patient management decisions. Negative results must be combined with clinical observations, patient history, and epidemiological information. The expected result is Negative.  Fact Sheet for Patients: SugarRoll.be  Fact Sheet for Healthcare Providers: https://www.woods-mathews.com/  This test is not yet approved or cleared by the [place] FDA and  has been authorized for detection and/or diagnosis of SARS-CoV-2 by FDA under an Emergency Use Authorization (EUA). This EUA will remain  in effect (meaning this test can be used) for the duration of the COVID-19 declaration under Se ction 564(b)(1) of the Act, 21 U.S.C. section 360bbb-3(b)(1), unless the authorization is terminated or revoked sooner.  Performed at [facility], [employer] [address] [place], [place] [address]   Culture, blood (routine x 2)     Status: None (Preliminary result)   Collection Time: 11/05/20 11:11 AM   Specimen: BLOOD  Result Value Ref Range Status   Specimen Description BLOOD LEFT ANTECUBITAL  Final   Special Requests   Final    BOTTLES DRAWN AEROBIC AND ANAEROBIC Blood Culture adequate volume   Culture   Final    NO GROWTH < 24 HOURS Performed at [facility], [place] [address], [place], [place] [address]    Report Status PENDING  Incomplete  Culture, blood (routine x 2)     Status: None (Preliminary result)   Collection Time: 11/05/20 11:18 AM   Specimen: BLOOD  LEFT HAND  Result Value Ref Range Status   Specimen Description BLOOD LEFT HAND  Final   Special Requests   Final    BOTTLES DRAWN AEROBIC AND ANAEROBIC Blood Culture adequate volume   Culture   Final    NO GROWTH < 24 HOURS Performed at [facility], [place] [address] [place], [place] [address]    Report Status PENDING  Incomplete  Gastrointestinal Panel by PCR , Stool     Status: None   Collection Time: 11/06/20 12:07 AM   Specimen: Stool  Result Value Ref Range Status   Campylobacter species NOT DETECTED NOT DETECTED Final   Plesimonas shigelloides NOT DETECTED NOT DETECTED Final   Salmonella species NOT DETECTED NOT DETECTED Final   Yersinia enterocolitica NOT DETECTED NOT DETECTED Final   Vibrio species NOT DETECTED NOT DETECTED Final   Vibrio cholerae NOT DETECTED NOT DETECTED Final   Enteroaggregative E coli (EAEC) NOT DETECTED NOT DETECTED Final   Enteropathogenic E coli (EPEC) NOT DETECTED NOT DETECTED Final   Enterotoxigenic E coli (ETEC) NOT DETECTED NOT DETECTED Final   Shiga like toxin producing E coli (STEC) NOT DETECTED NOT DETECTED Final   Shigella/Enteroinvasive E coli (EIEC) NOT DETECTED NOT DETECTED Final   Cryptosporidium NOT DETECTED NOT DETECTED Final   Cyclospora cayetanensis NOT DETECTED NOT DETECTED Final   Entamoeba histolytica NOT DETECTED NOT DETECTED Final   Giardia lamblia NOT DETECTED NOT DETECTED Final   Adenovirus F40/41 NOT DETECTED NOT DETECTED Final   Astrovirus NOT DETECTED NOT DETECTED Final   Norovirus GI/GII NOT  DETECTED NOT DETECTED Final   Rotavirus A NOT DETECTED NOT DETECTED Final   Sapovirus (I, II, IV, and V) NOT DETECTED NOT DETECTED Final    Comment: Performed at [facility], [place] [place], [place] [address]    Today   Subjective    Nolawi Bubier today has no headache,no chest abdominal pain,no new weakness tingling or numbness, feels much better wants to go home today.    Objective   Blood  pressure 105/75, pulse (!) 57, temperature 97.9 F (36.6 C), temperature source Oral, resp. rate 14, height '6\' 1"'$  (1.854 m), weight 63.3 kg, SpO2 100 %.   Intake/Output Summary (Last 24 hours) at 11/06/2020 1455 Last data filed at 11/06/2020 1157 Gross per 24 hour  Intake 2375.82 ml  Output 800 ml  Net 1575.82 ml    Exam  Awake Alert, No new F.N deficits, Normal affect [place].AT,PERRAL Supple Neck,No JVD, No cervical lymphadenopathy appriciated.  Symmetrical Chest wall movement, Good air movement bilaterally, CTAB RRR,No Gallops,Rubs or new Murmurs, No Parasternal Heave +ve B.Sounds, Abd Soft, Non tender, No organomegaly appriciated, No rebound -guarding or rigidity. No Cyanosis, Clubbing or edema, No new Rash or bruise   Data Review   CBC w Diff:  Lab Results  Component Value Date   WBC 7.0 11/04/2020   HGB 13.8 11/04/2020   HCT 39.2 11/04/2020   PLT 241 11/04/2020   LYMPHOPCT 24 11/04/2020   MONOPCT 9 11/04/2020   EOSPCT 2 11/04/2020   BASOPCT 1 11/04/2020    CMP:  Lab Results  Component Value Date   NA 139 11/06/2020   K 3.7 11/06/2020   CL 103 11/06/2020   CO2 28 11/06/2020   BUN 10 11/06/2020   CREATININE 0.83 11/06/2020   PROT 6.4 (L) 11/04/2020   ALBUMIN 3.6 11/04/2020   BILITOT 0.6 11/04/2020   ALKPHOS 95 11/04/2020   AST 13 (L) 11/04/2020   ALT 11 11/04/2020  .   Total Time in preparing paper work, data evaluation and todays exam - 3 minutes  Lala Lund M.D on 11/06/2020 at 2:55 PM  [facility]

## 2020-11-06 NOTE — Anesthesia Procedure Notes (Signed)
Procedure Name: MAC Date/Time: 11/06/2020 11:12 AM Performed by: Inda Coke, CRNA Pre-anesthesia Checklist: Patient identified, Emergency Drugs available, Suction available, Timeout performed and Patient being monitored Patient Re-evaluated:Patient Re-evaluated prior to induction Oxygen Delivery Method: Nasal cannula Induction Type: IV induction Dental Injury: Teeth and Oropharynx as per pre-operative assessment

## 2020-11-06 NOTE — Progress Notes (Signed)
Initial Nutrition Assessment  DOCUMENTATION CODES:   Underweight  INTERVENTION:   -RD will follow for diet advancement and add supplements as appropriate  NUTRITION DIAGNOSIS:   Inadequate oral intake related to altered GI function as evidenced by NPO status.  GOAL:   Patient will meet greater than or equal to 90% of their needs  MONITOR:   PO intake, Supplement acceptance, Diet advancement, Labs, Weight trends, Skin, I & O's  REASON FOR ASSESSMENT:   Consult Assessment of nutrition requirement/status  ASSESSMENT:   Dean Marquez  is a 60 y.o. male, with no significant past medical history except mid low back pain which has been now ongoing for 8 to 10 weeks radiating to the right lower extremity, generalized weakness and unintentional weight loss over 25 to 30 pounds, few falls at work with lightheadedness.  Pt admitted with ongoing back pain and rt lower extremity weakness.   Reviewed I/O's: +136 ml x 24 hours and +383 ml since admission  UOP: 2.3 L x 24 hours  Attempted to speak with pt x 3, however, out of room for procedure at time of visit. Unable to obtain further nutrition-related history or complete nutrition-focused physical exam at this time.   Per GI notes, pt has been experiencing early satiety and IBS like symptoms. Due to these symptoms, pt made very drastic changes to his diet (fat free, gluten free, and very low carbohydrates). Suspect suboptimal oral intake secondary to diet restrictions.   Reviewed wt hx; pt has experienced a 18.4% wt loss over the past 16 months. Per MD notes, pt has experienced a 35# weight loss.  Pt currently NPO for EGD and colonoscopy today.   Medications reviewed and include vitamin B-12, colace, miralax, and lactated ringers infusion @ 10 ml/hr.   Labs reviewed.   Diet Order:   Diet Order             Diet regular Room service appropriate? Yes; Fluid consistency: Thin  Diet effective now           Diet - low sodium  heart healthy                   EDUCATION NEEDS:   No education needs have been identified at this time  Skin:  Skin Assessment: Reviewed RN Assessment  Last BM:  11/06/20  Height:   Ht Readings from Last 1 Encounters:  11/04/20 '6\' 1"'$  (1.854 m)    Weight:   Wt Readings from Last 1 Encounters:  11/04/20 63.3 kg    Ideal Body Weight:  83.6 kg  BMI:  Body mass index is 18.42 kg/m.  Estimated Nutritional Needs:   Kcal:  [id number]  Protein:  140-160 grams  Fluid:  > 2 L    Loistine Chance, RD, LDN, [place] Registered Dietitian II Certified Diabetes Care and Education Specialist Please refer to [facility] for RD and/or RD on-call/weekend/after hours pager

## 2020-11-06 NOTE — Interval H&P Note (Signed)
History and Physical Interval Note: For EGD and colonoscopy today to evaluate weight loss, poor appetite previous early satiety, altered bowel habits. The nature of the procedure, as well as the risks, benefits, and alternatives were carefully and thoroughly reviewed with the patient. Ample time for discussion and questions allowed. The patient understood, was satisfied, and agreed to proceed.    11/06/2020 10:57 AM  Dean Marquez  has presented today for surgery, with the diagnosis of weight loss, early satiety, change in bowel habits.  The various methods of treatment have been discussed with the patient and family. After consideration of risks, benefits and other options for treatment, the patient has consented to  Procedure(s): ESOPHAGOGASTRODUODENOSCOPY (EGD) WITH PROPOFOL (N/A) COLONOSCOPY WITH PROPOFOL (N/A) as a surgical intervention.  The patient's history has been reviewed, patient examined, no change in status, stable for surgery.  I have reviewed the patient's chart and labs.  Questions were answered to the patient's satisfaction.     Lajuan Lines [facility]

## 2020-11-06 NOTE — Progress Notes (Signed)
Patient received second half of bowel prep at 0459. Patient was instructed that prep needed to be complete by 0700

## 2020-11-06 NOTE — Progress Notes (Signed)
Patient asked that doctor on call be asked if his telemetry monitor and IV fluids be stopped during the night in order to get better rest than he did the previous night. Stated he is ok with with them being restarted around 7am. On call provider informed of patient's request and said it was ok for both fluids and telemetry to be held throughout the night.

## 2020-11-06 NOTE — Op Note (Signed)
[facility] Patient Name: Dean Marquez Procedure Date : 11/06/2020 MRN: [id number] Attending MD: Jerene Bears , MD Date of Birth: May 02, 1960 CSN: [id number] Age: 60 Admit Type: Outpatient Procedure:                Colonoscopy Indications:              Change in bowel habits, unexplained weight loss,                            abdominal gas/bloating symptom Providers:                Lajuan Lines. Hilarie Fredrickson, MD, Doristine Johns, RN, Ladona Ridgel, Technician Referring MD:             [facility] Medicines:                Monitored Anesthesia Care Complications:            No immediate complications. Estimated Blood Loss:     Estimated blood loss was minimal. Procedure:                Pre-Anesthesia Assessment:                           - Prior to the procedure, a History and Physical                            was performed, and patient medications and                            allergies were reviewed. The patient's tolerance of                            previous anesthesia was also reviewed. The risks                            and benefits of the procedure and the sedation                            options and risks were discussed with the patient.                            All questions were answered, and informed consent                            was obtained. Prior Anticoagulants: The patient has                            taken no previous anticoagulant or antiplatelet                            agents. ASA Grade Assessment: II - A patient with  mild systemic disease. After reviewing the risks                            and benefits, the patient was deemed in                            satisfactory condition to undergo the procedure.                           After obtaining informed consent, the colonoscope                            was passed under direct vision. Throughout the                             procedure, the patient's blood pressure, pulse, and                            oxygen saturations were monitored continuously. The                            [id number] Olympus colonoscope was                            introduced through the anus and advanced to the                            terminal ileum. The colonoscopy was performed                            without difficulty. The patient tolerated the                            procedure well. The quality of the bowel                            preparation was good. The terminal ileum, ileocecal                            valve, appendiceal orifice, and rectum were                            photographed. Scope In: 11:32:00 AM Scope Out: 11:58:07 AM Scope Withdrawal Time: 0 hours 20 minutes 43 seconds  Total Procedure Duration: 0 hours 26 minutes 7 seconds  Findings:      The digital rectal exam was normal.      The terminal ileum appeared normal. Biopsies were taken with a cold       forceps for histology.      A 3 mm polyp was found in the ascending colon. The polyp was sessile.       The polyp was removed with a cold snare. Resection and retrieval were       complete.      The exam was otherwise normal throughout the examined colon.      Biopsies for  histology were taken with a cold forceps from the right       colon and left colon for evaluation of microscopic colitis.      The retroflexed view of the distal rectum and anal verge was normal and       showed no anal or rectal abnormalities. Impression:               - The examined portion of the ileum was normal.                            Biopsied.                           - One 3 mm polyp in the ascending colon, removed                            with a cold snare. Resected and retrieved.                           - The distal rectum and anal verge are normal on                            retroflexion view.                           - Biopsies were taken  with a cold forceps from the                            right colon and left colon for evaluation of                            microscopic colitis.                           - No overtly obvious source of weight loss found on                            today's EGD and colonoscopy. Moderate Sedation:      N/A Recommendation:           - Return patient to hospital ward for possible                            discharge same day.                           - Resume previous diet. Would recommend                            liberalizing diet with less food group restriction.                           - Continue present medications.                           - Agree with metronidazole treatment x 10 days.                           -  Await pathology results.                           - If symptoms persist after antibiotic treatment                            with more liberal diet (assuming biopsy results                            unrevealing) then consider trial of pancreatic                            enzyme replacement for possible EPI.                           - Repeat colonoscopy is recommended for                            surveillance. The colonoscopy date will be                            determined after pathology results from today's                            exam become available for review. Procedure Code(s):        --- Professional ---                           [phone], Colonoscopy, flexible; with removal of                            tumor(s), polyp(s), or other lesion(s) by snare                            technique                           [id number], 59, Colonoscopy, flexible; with biopsy,                            single or multiple Diagnosis Code(s):        --- Professional ---                           K63.5, Polyp of colon                           R19.4, Change in bowel habit                           R63.4, Abnormal weight loss CPT copyright 2019 American Medical  Association. All rights reserved. The codes documented in this report are preliminary and upon coder review may  be revised to meet current compliance requirements. Jerene Bears, MD 11/06/2020 12:17:18 PM This report has been signed electronically. Number of Addenda: 0

## 2020-11-06 NOTE — Discharge Instructions (Addendum)
Follow with Primary MD Patient, No Pcp Per (Inactive) in 7 days, follow Pending Quantiferon levels  Get CBC, CMP, b12 -  checked next visit within 4 weeks by Primary MD    Activity: As tolerated with Full fall precautions use walker/cane & assistance as needed  Disposition Home    Diet: Regular  Special Instructions: If you have smoked or chewed Tobacco  in the last 2 yrs please stop smoking, stop any regular Alcohol  and or any Recreational drug use.  On your next visit with your primary care physician please Get Medicines reviewed and adjusted.  Please request your Prim.MD to go over all Hospital Tests and Procedure/Radiological results at the follow up, please get all Hospital records sent to your Prim MD by signing hospital release before you go home.  If you experience worsening of your admission symptoms, develop shortness of breath, life threatening emergency, suicidal or homicidal thoughts you must seek medical attention immediately by calling 911 or calling your MD immediately  if symptoms less severe.  You Must read complete instructions/literature along with all the possible adverse reactions/side effects for all the Medicines you take and that have been prescribed to you. Take any new Medicines after you have completely understood and accpet all the possible adverse reactions/side effects.

## 2020-11-06 NOTE — Op Note (Signed)
[facility] Patient Name: Dean Marquez Procedure Date : 11/06/2020 MRN: [id number] Attending MD: Jerene Bears , MD Date of Birth: 03-11-1961 CSN: [id number] Age: 60 Admit Type: Outpatient Procedure:                Upper GI endoscopy Indications:              Abdominal gas symptom, previous early satiety with                            poor appetite, unexplained weight loss Providers:                Lajuan Lines. Hilarie Fredrickson, MD, Doristine Johns, RN, Ladona Ridgel, Technician Referring MD:             [facility] Medicines:                Monitored Anesthesia Care Complications:            No immediate complications. Estimated Blood Loss:     Estimated blood loss was minimal. Procedure:                Pre-Anesthesia Assessment:                           - Prior to the procedure, a History and Physical                            was performed, and patient medications and                            allergies were reviewed. The patient's tolerance of                            previous anesthesia was also reviewed. The risks                            and benefits of the procedure and the sedation                            options and risks were discussed with the patient.                            All questions were answered, and informed consent                            was obtained. Prior Anticoagulants: The patient has                            taken no previous anticoagulant or antiplatelet                            agents. ASA Grade Assessment: II - A patient with  mild systemic disease. After reviewing the risks                            and benefits, the patient was deemed in                            satisfactory condition to undergo the procedure.                           After obtaining informed consent, the endoscope was                            passed under direct vision. Throughout the                             procedure, the patient's blood pressure, pulse, and                            oxygen saturations were monitored continuously. The                            [id number] Olympus endoscope was introduced                            through the mouth, and advanced to the second part                            of duodenum. The upper GI endoscopy was                            accomplished without difficulty. The patient                            tolerated the procedure well. Scope In: Scope Out: Findings:      Normal mucosa was found in the entire esophagus.      A non-obstructing Schatzki ring was found at the gastroesophageal       junction. Z-line is regular at 39 cm.      A 2 cm hiatal hernia was present.      The gastroesophageal flap valve was visualized endoscopically and       classified as Hill Grade III (minimal fold, loose to endoscope, hiatal       hernia likely).      The entire examined stomach was normal. Biopsies were taken with a cold       forceps for histology and Helicobacter pylori testing.      The examined duodenum was normal. Biopsies for histology were taken with       a cold forceps for evaluation of celiac disease. Impression:               - Normal mucosa was found in the entire esophagus.                           - Non-obstructing Schatzki ring at GE junction.                           -  2 cm hiatal hernia.                           - Normal stomach. Biopsied.                           - Normal examined duodenum. Biopsied. Moderate Sedation:      N/A Recommendation:           - Return patient to hospital ward for possible                            discharge same day.                           - Resume previous diet.                           - Continue present medications.                           - See the other procedure note for documentation of                            additional recommendations.                           -  Await pathology results. Procedure Code(s):        --- Professional ---                           [phone], Esophagogastroduodenoscopy, flexible,                            transoral; with biopsy, single or multiple Diagnosis Code(s):        --- Professional ---                           K22.2, Esophageal obstruction                           K44.9, Diaphragmatic hernia without obstruction or                            gangrene                           R14.0, Abdominal distension (gaseous)                           R68.81, Early satiety                           R63.4, Abnormal weight loss CPT copyright 2019 American Medical Association. All rights reserved. The codes documented in this report are preliminary and upon coder review may  be revised to meet current compliance requirements. Jerene Bears, MD 11/06/2020 12:10:56 PM This report has been signed electronically. Number of Addenda: 0

## 2020-11-06 NOTE — Progress Notes (Signed)
0629, bowel prep complete

## 2020-11-06 NOTE — Progress Notes (Signed)
OT Cancellation Note  Patient Details Name: Dean Marquez MRN: [id number] DOB: [birth year]   Cancelled Treatment:    Reason Eval/Treat Not Completed: Patient at procedure or test/ unavailable  [name] 11/06/2020, 12:19 PM

## 2020-11-06 NOTE — Anesthesia Postprocedure Evaluation (Signed)
Anesthesia Post Note  Patient: Dean Marquez  Procedure(s) Performed: ESOPHAGOGASTRODUODENOSCOPY (EGD) WITH PROPOFOL COLONOSCOPY WITH PROPOFOL BIOPSY POLYPECTOMY     Patient location during evaluation: Endoscopy Anesthesia Type: MAC Level of consciousness: awake and alert Pain management: pain level controlled Vital Signs Assessment: post-procedure vital signs reviewed and stable Respiratory status: spontaneous breathing, nonlabored ventilation, respiratory function stable and patient connected to nasal cannula oxygen Cardiovascular status: blood pressure returned to baseline and stable Postop Assessment: no apparent nausea or vomiting Anesthetic complications: no   No notable events documented.  Last Vitals:  Vitals:   11/06/20 1220 11/06/20 1225  BP: (!) 97/51 (!) 101/55  Pulse: (!) 59 (!) 57  Resp: 18 14  Temp:    SpO2: 100% 100%    Last Pain:  Vitals:   11/06/20 1225  TempSrc:   PainSc: 0-No pain                 Barnet Glasgow

## 2020-11-06 NOTE — TOC Transition Note (Signed)
Transition of Care [facility]) - CM/SW Discharge Note   Patient Details  Name: Dean Marquez MRN: [id number] Date of Birth: 05-02-1960  Transition of [facility] CM/SW Contact:  Pollie Friar, RN Phone Number: 11/06/2020, 1:26 PM   Clinical Narrative:    Patient discharging home with resumption of his outpatient therapy. Pt was attending [place] [facility] for outpatient.  Pt provided supplies from OT for home. PCP: Dr Miachel Roux in [place] has transportation home.    Final next level of care: OP Rehab Barriers to Discharge: No Barriers Identified   Patient Goals and CMS Choice        Discharge Placement                       Discharge Plan and Services                                     Social Determinants of Health (SDOH) Interventions     Readmission Risk Interventions No flowsheet data found.

## 2020-11-06 NOTE — Transfer of Care (Signed)
Immediate Anesthesia Transfer of Care Note  Patient: Dean Marquez  Procedure(s) Performed: ESOPHAGOGASTRODUODENOSCOPY (EGD) WITH PROPOFOL COLONOSCOPY WITH PROPOFOL BIOPSY POLYPECTOMY  Patient Location: Endoscopy Unit  Anesthesia Type:MAC  Level of Consciousness: awake, alert  and oriented  Airway & Oxygen Therapy: Patient Spontanous Breathing and Patient connected to nasal cannula oxygen  Post-op Assessment: Report given to RN and Post -op Vital signs reviewed and stable  Post vital signs: Reviewed and stable  Last Vitals:  Vitals Value Taken Time  BP    Temp    Pulse    Resp    SpO2      Last Pain:  Vitals:   11/06/20 1050  TempSrc: Oral  PainSc: 0-No pain      Patients Stated Pain Goal: 2 (78/58/85 0277)  Complications: No notable events documented.

## 2020-11-09 LAB — SURGICAL PATHOLOGY

## 2020-11-10 ENCOUNTER — Encounter (HOSPITAL_COMMUNITY): Payer: Self-pay | Admitting: Internal Medicine

## 2020-11-10 LAB — CULTURE, BLOOD (ROUTINE X 2)
Culture: NO GROWTH
Culture: NO GROWTH
Special Requests: ADEQUATE
Special Requests: ADEQUATE

## 2020-11-10 LAB — PANCREATIC ELASTASE, FECAL: Pancreatic Elastase-1, Stool: 180 ug Elast./g — ABNORMAL LOW (ref >200)

## 2020-11-11 LAB — STOOL CULTURE: E coli, Shiga toxin Assay: NEGATIVE

## 2020-11-11 LAB — STOOL CULTURE REFLEX - CMPCXR

## 2020-11-11 LAB — STOOL CULTURE REFLEX - RSASHR

## 2020-11-12 LAB — QUANTIFERON-TB GOLD PLUS (RQFGPL)
QuantiFERON Mitogen Value: >10 IU/mL
QuantiFERON Nil Value: 0.06 IU/mL
QuantiFERON TB1 Ag Value: 0.11 IU/mL
QuantiFERON TB2 Ag Value: 0.08 IU/mL

## 2020-11-12 LAB — QUANTIFERON-TB GOLD PLUS: QuantiFERON-TB Gold Plus: NEGATIVE

## 2020-11-17 LAB — O&P RESULT

## 2020-11-17 LAB — OVA + PARASITE EXAM

## 2020-12-28 ENCOUNTER — Telehealth: Payer: Self-pay | Admitting: Internal Medicine

## 2020-12-28 MED ORDER — PANCRELIPASE (LIP-PROT-AMYL) 36000-114000 UNITS PO CPEP: 72000.0000 [IU] | ORAL_CAPSULE | Freq: Three times a day (TID) | ORAL | 0 refills | Status: AC

## 2020-12-28 NOTE — Telephone Encounter (Signed)
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 He contacted me recently and stated that his abdominal discomfort and loose stools as well as abdominal bloating had recurred  At this point I recommend we try Creon 36,000 units; 2 capsules with every meal He should try this for the next several weeks to a month and let me know how he responds Please send prescription Diagnosis exocrine pancreatic insufficiency  Thanks JMP

## 2020-12-28 NOTE — Telephone Encounter (Signed)
Rx sent as requested by Dr Hilarie Fredrickson.

## 2020-12-28 NOTE — Addendum Note (Signed)
Addended by: Larina Bras on: 12/28/2020 05:06 PM   Modules accepted: Orders

## 2022-05-03 IMAGING — CT CT CHEST-ABD-PELV W/ CM
3 of 5 series · 14 of 36 positions shown, 16 images · IV contrast (APPLIED)
Comparison: No priors.

CLINICAL DATA: 60-year-old male with 25 pounds of unintended weight
loss over the past 2-3 months.

EXAM:
CT CHEST, ABDOMEN, AND PELVIS WITH CONTRAST
TECHNIQUE: Multidetector CT imaging of the chest, abdomen and pelvis was
performed following the standard protocol during bolus
administration of intravenous contrast.
CONTRAST:  100mL OMNIPAQUE IOHEXOL 300 MG/ML  SOLN

[Series 4: cap 5.0 i31f 2 · axial · 0.80mm/px · z∈[+764,+1324]mm · 9 of 140 slices shown, 11 images]
[im 14/140  mediastinal]
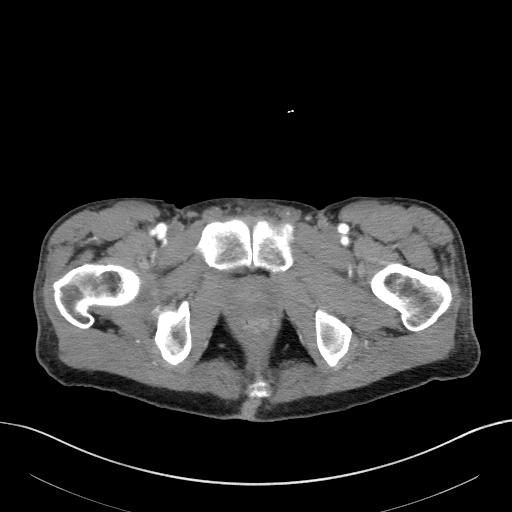
[im 14/140  bone]
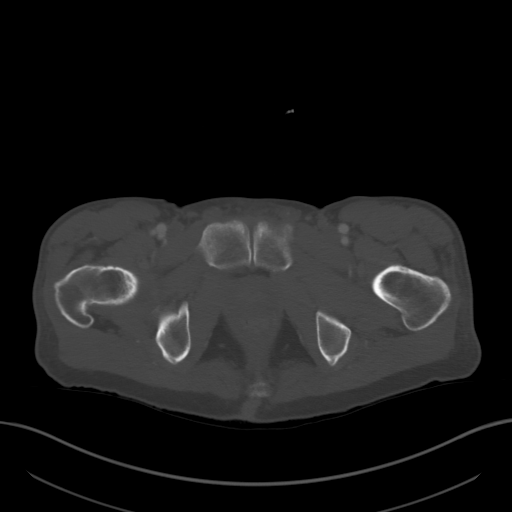
[im 28/140  mediastinal]
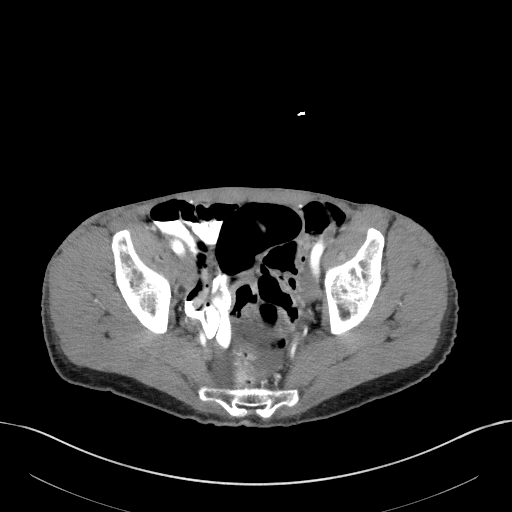
[im 42/140  mediastinal]
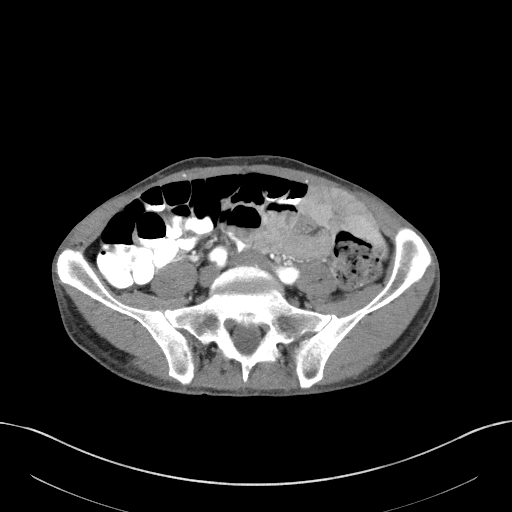
[im 56/140  mediastinal]
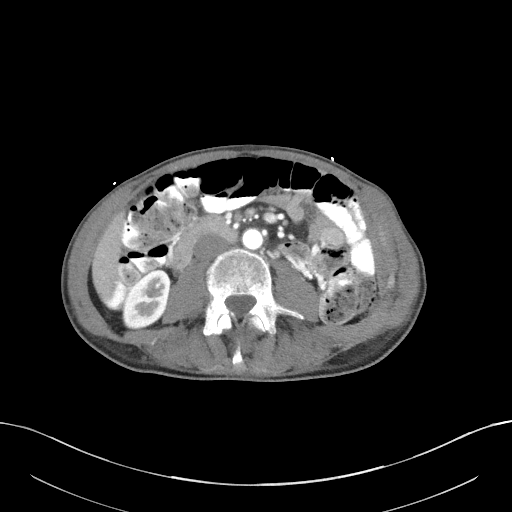
[im 70/140  mediastinal]
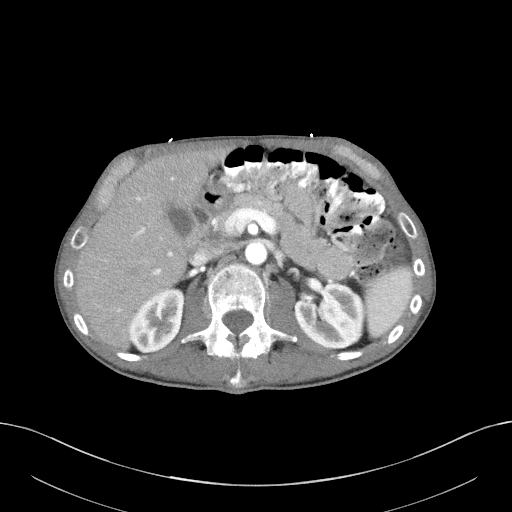
[im 84/140  mediastinal]
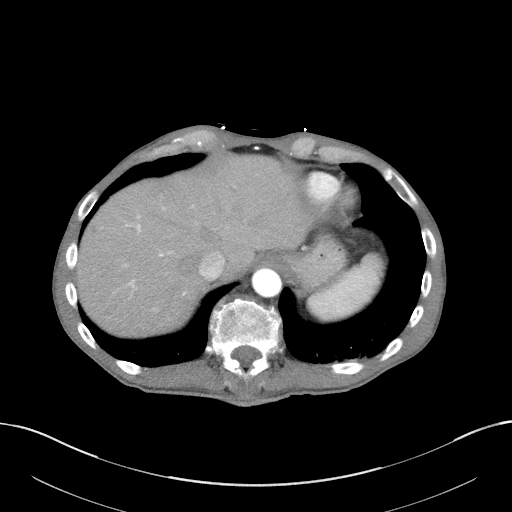
[im 98/140  mediastinal]
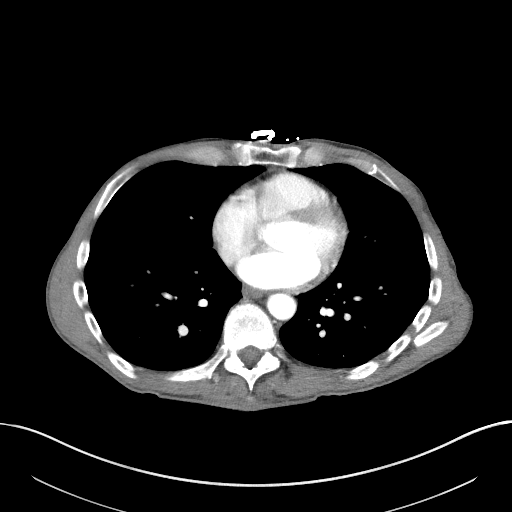
[im 112/140  mediastinal]
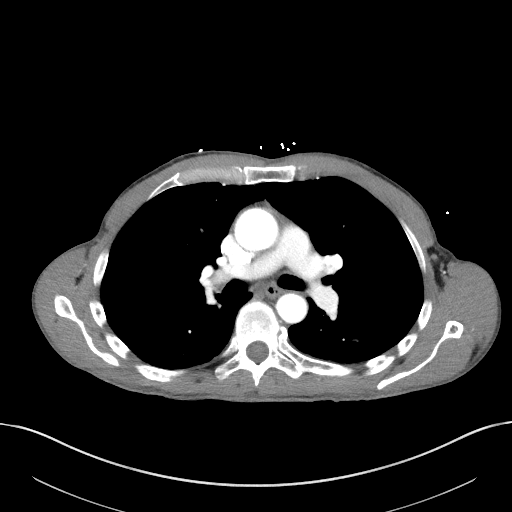
[im 126/140  mediastinal]
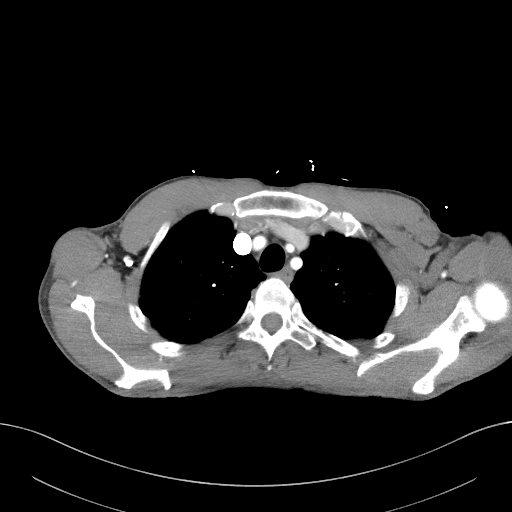
[im 126/140  bone]
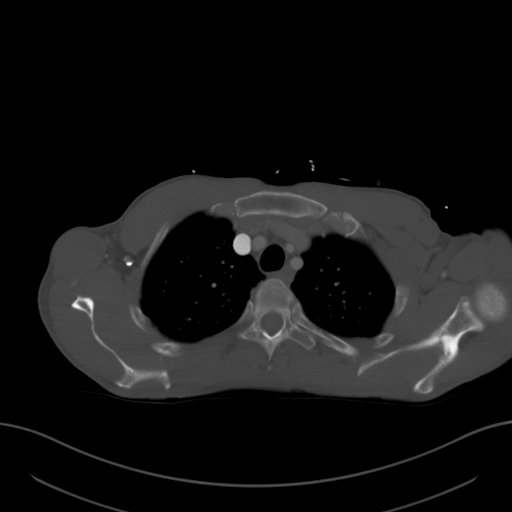

[Series 5: lungs · axial · 0.80mm/px · z∈[+1085,+1137]mm · 2 of 170 slices shown]
[im 14/170  mediastinal]
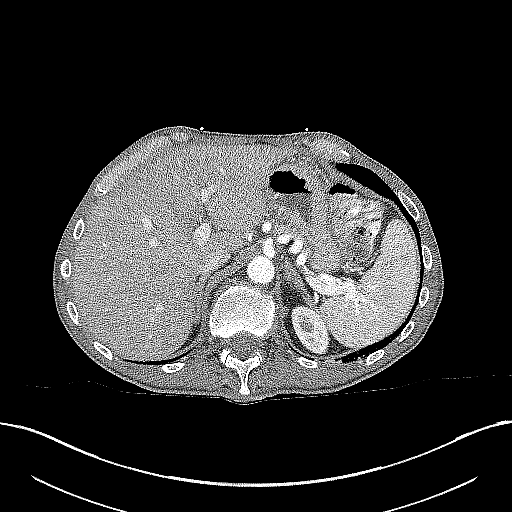
[im 40/170  mediastinal]
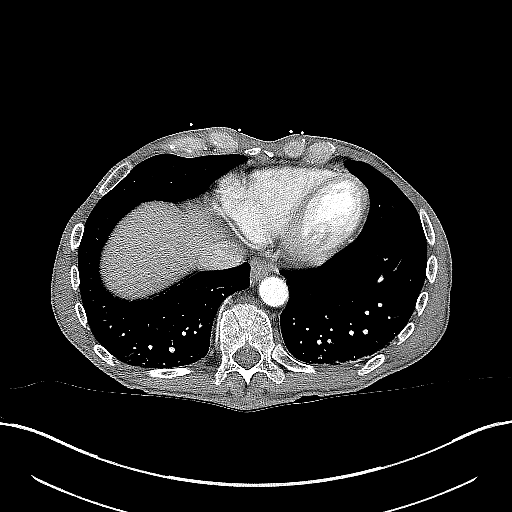

[Series 7: coronal · coronal · 0.86mm/px · 3 of 118 slices shown]
[im 24/118  mediastinal]
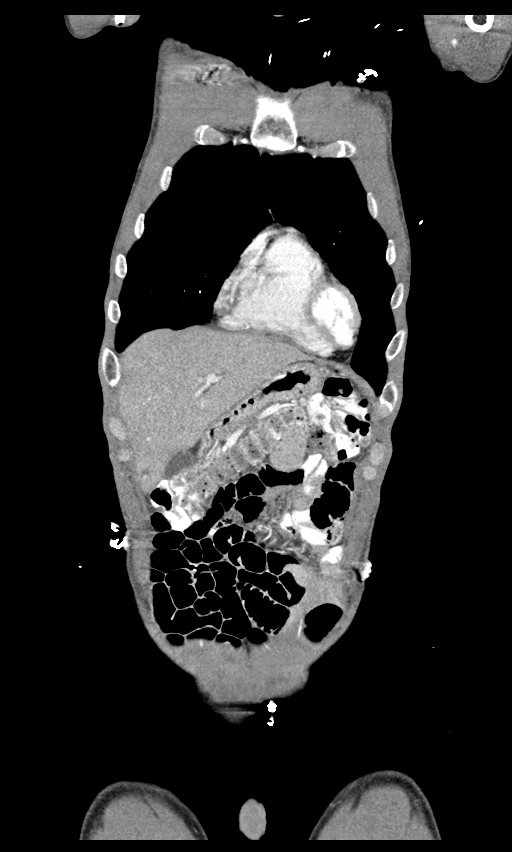
[im 47/118  mediastinal]
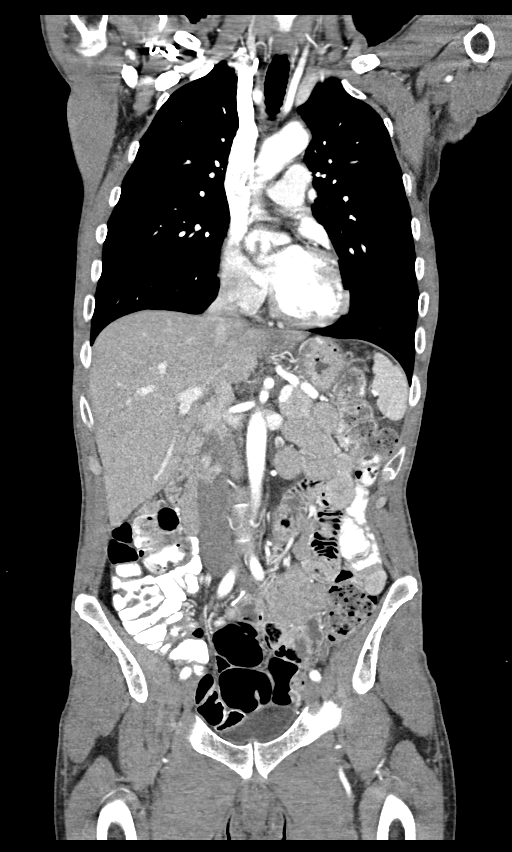
[im 71/118  mediastinal]
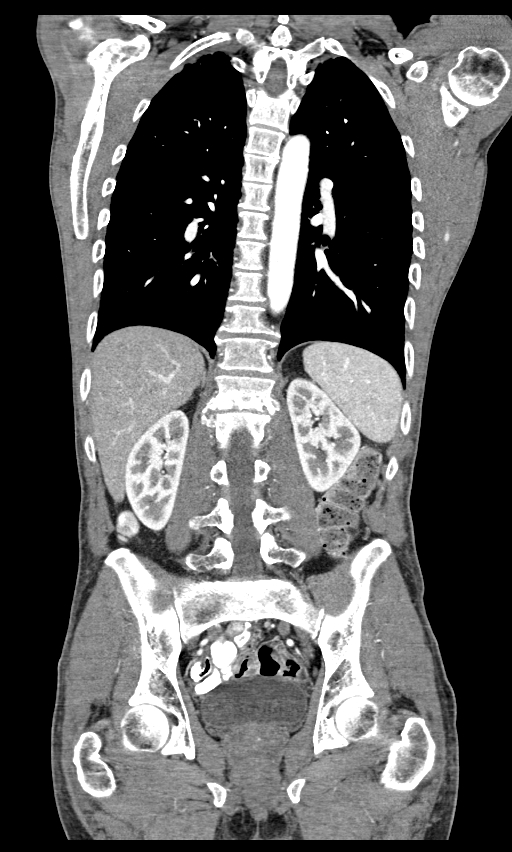

[14 of 36 positions shown; findings below may reference images not displayed]

FINDINGS: CT CHEST FINDINGS

Cardiovascular: Heart size is normal. There is no significant
pericardial fluid, thickening or pericardial calcification. No
atherosclerotic calcifications in the thoracic aorta or the coronary
arteries.

Mediastinum/Nodes: No pathologically enlarged mediastinal or hilar
lymph nodes. Esophagus is unremarkable in appearance. No axillary
lymphadenopathy.

Lungs/Pleura: Small calcified granuloma in the right lower lobe. No
other suspicious appearing pulmonary nodules or masses are noted. No
acute consolidative airspace disease. No pleural effusions. Mild
pleuroparenchymal thickening and architectural distortion in the
lung apices bilaterally, most compatible with areas of chronic post
infectious or inflammatory scarring. Mild scarring also noted in the
basal segments of the left lower lobe.

Musculoskeletal: There are no aggressive appearing lytic or blastic
lesions noted in the visualized portions of the skeleton.

CT ABDOMEN PELVIS FINDINGS

Hepatobiliary: There are 2 subcentimeter liver lesions in the right
lobe of the liver in segment 5 (axial image 79 of series 4) and in
segment 6 (axial image 82 of series 4) which are too small to
characterize. No intra or extrahepatic biliary ductal dilatation.
Gallbladder is normal in appearance.

Pancreas: No pancreatic mass. No pancreatic ductal dilatation. No
pancreatic or peripancreatic fluid collections or inflammatory
changes.

Spleen: Unremarkable.

Adrenals/Urinary Tract: 1.4 cm low-attenuation lesion in the upper
pole of the left kidney compatible with a simple cyst. Right kidney
and bilateral adrenal glands are normal in appearance. No
hydroureteronephrosis. Urinary bladder is normal in appearance.

Stomach/Bowel: The appearance of the stomach is normal. There is no
pathologic dilatation of small bowel or colon. Normal appendix.

Vascular/Lymphatic: No significant atherosclerotic disease, aneurysm
or dissection noted in the abdominal or pelvic vasculature. No
lymphadenopathy noted in the abdomen or pelvis.

Reproductive: Prostate gland and seminal vesicles are unremarkable
in appearance. Bilateral vasectomy clips are noted.

Other: Small volume of ascites adjacent to the rectum. No
pneumoperitoneum.

Musculoskeletal: Advanced loss of disc height at L1-L2 with
extensive areas of sclerosis and lucency in the adjacent vertebral
body endplates.
IMPRESSION: 1. No definite findings in the chest, abdomen or pelvis to account
for the patient's history of weight loss.
2. Small volume of ascites is of uncertain etiology and
significance.
3. Findings at L1-L[DATE] be related to advanced degenerative disc
disease, although the possibility of discitis and osteomyelitis is
not excluded. See report for MRI of the thoracolumbar spine dated
11/04/2020 for full description.
4. Additional incidental findings, as above.
